# Patient Record
Sex: Male | Born: 1965
Health system: Southern US, Community
[De-identification: ages and names within clinical notes are randomized; demographics above are authoritative.]

## PROBLEM LIST (undated history)

## (undated) DIAGNOSIS — G4730 Sleep apnea, unspecified: Secondary | ICD-10-CM

## (undated) DIAGNOSIS — Z8679 Personal history of other diseases of the circulatory system: Secondary | ICD-10-CM

## (undated) DIAGNOSIS — I10 Essential (primary) hypertension: Secondary | ICD-10-CM

## (undated) HISTORY — DX: Personal history of other diseases of the circulatory system: Z86.79

## (undated) HISTORY — DX: Sleep apnea, unspecified: G47.30

---

## 2005-12-02 ENCOUNTER — Emergency Department (HOSPITAL_COMMUNITY): Admission: EM | Admit: 2005-12-02 | Discharge: 2005-12-02 | Payer: Self-pay | Admitting: Emergency Medicine

## 2005-12-04 ENCOUNTER — Emergency Department (HOSPITAL_COMMUNITY): Admission: EM | Admit: 2005-12-04 | Discharge: 2005-12-04 | Payer: Self-pay | Admitting: Family Medicine

## 2011-07-18 ENCOUNTER — Inpatient Hospital Stay (INDEPENDENT_AMBULATORY_CARE_PROVIDER_SITE_OTHER)
Admission: RE | Admit: 2011-07-18 | Discharge: 2011-07-18 | Disposition: A | Discharge status: Home / Self Care | Payer: BC Managed Care – PPO | Source: Ambulatory Visit | Attending: Family Medicine | Admitting: Family Medicine

## 2011-07-18 DIAGNOSIS — K047 Periapical abscess without sinus: Secondary | ICD-10-CM

## 2014-11-22 ENCOUNTER — Encounter (HOSPITAL_COMMUNITY): Payer: Self-pay | Admitting: *Deleted

## 2014-11-22 ENCOUNTER — Emergency Department (HOSPITAL_COMMUNITY)
Admission: EM | Admit: 2014-11-22 | Discharge: 2014-11-22 | Disposition: A | Discharge status: Home / Self Care | Payer: 59 | Attending: Emergency Medicine | Admitting: Emergency Medicine

## 2014-11-22 DIAGNOSIS — S29002A Unspecified injury of muscle and tendon of back wall of thorax, initial encounter: Secondary | ICD-10-CM | POA: Insufficient documentation

## 2014-11-22 DIAGNOSIS — S8991XA Unspecified injury of right lower leg, initial encounter: Secondary | ICD-10-CM | POA: Insufficient documentation

## 2014-11-22 DIAGNOSIS — Z79899 Other long term (current) drug therapy: Secondary | ICD-10-CM | POA: Diagnosis not present

## 2014-11-22 DIAGNOSIS — Y9389 Activity, other specified: Secondary | ICD-10-CM | POA: Diagnosis not present

## 2014-11-22 DIAGNOSIS — Y92410 Unspecified street and highway as the place of occurrence of the external cause: Secondary | ICD-10-CM | POA: Diagnosis not present

## 2014-11-22 DIAGNOSIS — Y998 Other external cause status: Secondary | ICD-10-CM | POA: Diagnosis not present

## 2014-11-22 DIAGNOSIS — Z791 Long term (current) use of non-steroidal anti-inflammatories (NSAID): Secondary | ICD-10-CM | POA: Diagnosis not present

## 2014-11-22 DIAGNOSIS — I10 Essential (primary) hypertension: Secondary | ICD-10-CM | POA: Diagnosis not present

## 2014-11-22 HISTORY — DX: Essential (primary) hypertension: I10

## 2014-11-22 MED ORDER — NEBIVOLOL HCL 5 MG PO TABS: 5.0000 mg | ORAL_TABLET | Freq: Every day | ORAL | Status: DC

## 2014-11-22 MED ORDER — NEBIVOLOL HCL 5 MG PO TABS
5.0000 mg | ORAL_TABLET | Freq: Every day | ORAL | Status: DC
  Administered 2014-11-22: 5 mg via ORAL
  Filled 2014-11-22: qty 1

## 2014-11-22 MED ORDER — HYDROCHLOROTHIAZIDE 25 MG PO TABS: 25.0000 mg | ORAL_TABLET | Freq: Every day | ORAL | Status: DC

## 2014-11-22 MED ORDER — HYDROCHLOROTHIAZIDE 25 MG PO TABS
25.0000 mg | ORAL_TABLET | Freq: Every day | ORAL | Status: DC
  Administered 2014-11-22: 25 mg via ORAL
  Filled 2014-11-22: qty 1
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
## 2018-02-18 DIAGNOSIS — Z8679 Personal history of other diseases of the circulatory system: Secondary | ICD-10-CM

## 2018-02-18 DIAGNOSIS — I639 Cerebral infarction, unspecified: Secondary | ICD-10-CM

## 2018-02-18 HISTORY — DX: Personal history of other diseases of the circulatory system: Z86.79

## 2018-02-18 HISTORY — DX: Cerebral infarction, unspecified: I63.9

## 2018-02-27 ENCOUNTER — Emergency Department (HOSPITAL_COMMUNITY): Payer: BLUE CROSS/BLUE SHIELD | Admitting: Certified Registered"

## 2018-02-27 ENCOUNTER — Emergency Department (HOSPITAL_COMMUNITY): Payer: BLUE CROSS/BLUE SHIELD

## 2018-02-27 ENCOUNTER — Inpatient Hospital Stay (HOSPITAL_COMMUNITY)
Admission: EM | Admit: 2018-02-27 | Discharge: 2018-03-15 | DRG: 024 | Disposition: A | Discharge status: Skilled Nursing Facility | Payer: BLUE CROSS/BLUE SHIELD | Attending: Neurological Surgery | Admitting: Neurological Surgery

## 2018-02-27 ENCOUNTER — Inpatient Hospital Stay (HOSPITAL_COMMUNITY)
Admission: EM | Disposition: A | Discharge status: Skilled Nursing Facility | Payer: Self-pay | Source: Home / Self Care | Attending: Neurological Surgery

## 2018-02-27 ENCOUNTER — Encounter (HOSPITAL_COMMUNITY): Payer: Self-pay | Admitting: Emergency Medicine

## 2018-02-27 ENCOUNTER — Inpatient Hospital Stay (HOSPITAL_COMMUNITY): Payer: BLUE CROSS/BLUE SHIELD

## 2018-02-27 DIAGNOSIS — E785 Hyperlipidemia, unspecified: Secondary | ICD-10-CM | POA: Diagnosis not present

## 2018-02-27 DIAGNOSIS — R260 Ataxic gait: Secondary | ICD-10-CM | POA: Diagnosis not present

## 2018-02-27 DIAGNOSIS — G9340 Encephalopathy, unspecified: Secondary | ICD-10-CM | POA: Diagnosis present

## 2018-02-27 DIAGNOSIS — R279 Unspecified lack of coordination: Secondary | ICD-10-CM | POA: Diagnosis not present

## 2018-02-27 DIAGNOSIS — I509 Heart failure, unspecified: Secondary | ICD-10-CM | POA: Diagnosis not present

## 2018-02-27 DIAGNOSIS — S0240DA Maxillary fracture, left side, initial encounter for closed fracture: Secondary | ICD-10-CM | POA: Diagnosis not present

## 2018-02-27 DIAGNOSIS — I614 Nontraumatic intracerebral hemorrhage in cerebellum: Principal | ICD-10-CM

## 2018-02-27 DIAGNOSIS — G919 Hydrocephalus, unspecified: Secondary | ICD-10-CM | POA: Diagnosis not present

## 2018-02-27 DIAGNOSIS — M6281 Muscle weakness (generalized): Secondary | ICD-10-CM | POA: Diagnosis not present

## 2018-02-27 DIAGNOSIS — W1830XA Fall on same level, unspecified, initial encounter: Secondary | ICD-10-CM | POA: Diagnosis present

## 2018-02-27 DIAGNOSIS — R51 Headache: Secondary | ICD-10-CM | POA: Diagnosis not present

## 2018-02-27 DIAGNOSIS — I10 Essential (primary) hypertension: Secondary | ICD-10-CM | POA: Diagnosis present

## 2018-02-27 DIAGNOSIS — Z9889 Other specified postprocedural states: Secondary | ICD-10-CM | POA: Diagnosis not present

## 2018-02-27 DIAGNOSIS — T50906A Underdosing of unspecified drugs, medicaments and biological substances, initial encounter: Secondary | ICD-10-CM | POA: Diagnosis present

## 2018-02-27 DIAGNOSIS — I161 Hypertensive emergency: Secondary | ICD-10-CM | POA: Diagnosis not present

## 2018-02-27 DIAGNOSIS — R29708 NIHSS score 8: Secondary | ICD-10-CM | POA: Diagnosis present

## 2018-02-27 DIAGNOSIS — Y92002 Bathroom of unspecified non-institutional (private) residence single-family (private) house as the place of occurrence of the external cause: Secondary | ICD-10-CM | POA: Diagnosis not present

## 2018-02-27 DIAGNOSIS — R402242 Coma scale, best verbal response, confused conversation, at arrival to emergency department: Secondary | ICD-10-CM | POA: Diagnosis not present

## 2018-02-27 DIAGNOSIS — R402142 Coma scale, eyes open, spontaneous, at arrival to emergency department: Secondary | ICD-10-CM | POA: Diagnosis present

## 2018-02-27 DIAGNOSIS — J9690 Respiratory failure, unspecified, unspecified whether with hypoxia or hypercapnia: Secondary | ICD-10-CM

## 2018-02-27 DIAGNOSIS — R402362 Coma scale, best motor response, obeys commands, at arrival to emergency department: Secondary | ICD-10-CM | POA: Diagnosis not present

## 2018-02-27 DIAGNOSIS — Z79899 Other long term (current) drug therapy: Secondary | ICD-10-CM

## 2018-02-27 DIAGNOSIS — S0232XS Fracture of orbital floor, left side, sequela: Secondary | ICD-10-CM | POA: Diagnosis not present

## 2018-02-27 DIAGNOSIS — N281 Cyst of kidney, acquired: Secondary | ICD-10-CM | POA: Diagnosis not present

## 2018-02-27 DIAGNOSIS — S01112A Laceration without foreign body of left eyelid and periocular area, initial encounter: Secondary | ICD-10-CM | POA: Diagnosis not present

## 2018-02-27 DIAGNOSIS — Z4659 Encounter for fitting and adjustment of other gastrointestinal appliance and device: Secondary | ICD-10-CM

## 2018-02-27 DIAGNOSIS — H1132 Conjunctival hemorrhage, left eye: Secondary | ICD-10-CM | POA: Diagnosis not present

## 2018-02-27 DIAGNOSIS — Z01818 Encounter for other preprocedural examination: Secondary | ICD-10-CM

## 2018-02-27 DIAGNOSIS — S0232XA Fracture of orbital floor, left side, initial encounter for closed fracture: Secondary | ICD-10-CM | POA: Diagnosis not present

## 2018-02-27 DIAGNOSIS — Z91128 Patient's intentional underdosing of medication regimen for other reason: Secondary | ICD-10-CM

## 2018-02-27 DIAGNOSIS — S06350A Traumatic hemorrhage of left cerebrum without loss of consciousness, initial encounter: Secondary | ICD-10-CM | POA: Diagnosis not present

## 2018-02-27 DIAGNOSIS — G914 Hydrocephalus in diseases classified elsewhere: Secondary | ICD-10-CM | POA: Diagnosis not present

## 2018-02-27 HISTORY — PX: FACIAL LACERATION REPAIR: SHX6589

## 2018-02-27 HISTORY — PX: CRANIOTOMY: SHX93

## 2018-02-27 LAB — I-STAT CHEM 8, ED
BUN: 11 mg/dL (ref 6–20)
Calcium, Ion: 1.08 mmol/L — ABNORMAL LOW (ref 1.15–1.40)
Chloride: 103 mmol/L (ref 101–111)
Creatinine, Ser: 1.2 mg/dL (ref 0.61–1.24)
Glucose, Bld: 141 mg/dL — ABNORMAL HIGH (ref 65–99)
HEMATOCRIT: 54 % — AB (ref 39.0–52.0)
HEMOGLOBIN: 18.4 g/dL — AB (ref 13.0–17.0)
POTASSIUM: 3.9 mmol/L (ref 3.5–5.1)
SODIUM: 138 mmol/L (ref 135–145)
TCO2: 22 mmol/L (ref 22–32)

## 2018-02-27 LAB — COMPREHENSIVE METABOLIC PANEL
ALBUMIN: 4.6 g/dL (ref 3.5–5.0)
ALT: 26 U/L (ref 17–63)
AST: 46 U/L — AB (ref 15–41)
Alkaline Phosphatase: 88 U/L (ref 38–126)
Anion gap: 17 — ABNORMAL HIGH (ref 5–15)
BILIRUBIN TOTAL: 0.9 mg/dL (ref 0.3–1.2)
BUN: 9 mg/dL (ref 6–20)
CHLORIDE: 101 mmol/L (ref 101–111)
CO2: 19 mmol/L — ABNORMAL LOW (ref 22–32)
CREATININE: 1.33 mg/dL — AB (ref 0.61–1.24)
Calcium: 9.7 mg/dL (ref 8.9–10.3)
GFR calc Af Amer: >60 mL/min (ref >60)
GLUCOSE: 131 mg/dL — AB (ref 65–99)
POTASSIUM: 4.1 mmol/L (ref 3.5–5.1)
Sodium: 137 mmol/L (ref 135–145)
Total Protein: 8.3 g/dL — ABNORMAL HIGH (ref 6.5–8.1)

## 2018-02-27 LAB — CBC
HCT: 49.4 % (ref 39.0–52.0)
Hemoglobin: 16.4 g/dL (ref 13.0–17.0)
MCH: 28.3 pg (ref 26.0–34.0)
MCHC: 33.2 g/dL (ref 30.0–36.0)
MCV: 85.2 fL (ref 78.0–100.0)
PLATELETS: 232 10*3/uL (ref 150–400)
RBC: 5.8 MIL/uL (ref 4.22–5.81)
RDW: 15.9 % — AB (ref 11.5–15.5)
WBC: 17.6 10*3/uL — AB (ref 4.0–10.5)

## 2018-02-27 LAB — PROTIME-INR
INR: 0.98
Prothrombin Time: 12.9 seconds (ref 11.4–15.2)

## 2018-02-27 LAB — I-STAT CG4 LACTIC ACID, ED: Lactic Acid, Venous: 4.87 mmol/L (ref 0.5–1.9)

## 2018-02-27 LAB — ETHANOL: Alcohol, Ethyl (B): <10 mg/dL (ref <10)

## 2018-02-27 SURGERY — CRANIOTOMY HEMATOMA EVACUATION SUBDURAL
Anesthesia: General | Site: Head

## 2018-02-27 MED ORDER — MICROFIBRILLAR COLL HEMOSTAT EX PADS
MEDICATED_PAD | CUTANEOUS | Status: DC | PRN
  Administered 2018-02-27: 1 via TOPICAL

## 2018-02-27 MED ORDER — ACETAMINOPHEN 650 MG RE SUPP: 650.0000 mg | RECTAL | Status: DC | PRN

## 2018-02-27 MED ORDER — OXYCODONE HCL 5 MG PO TABS: 5.0000 mg | ORAL_TABLET | Freq: Once | ORAL | Status: DC | PRN

## 2018-02-27 MED ORDER — THROMBIN (RECOMBINANT) 20000 UNITS EX SOLR
CUTANEOUS | Status: DC | PRN
  Administered 2018-02-27: 20 mL via TOPICAL

## 2018-02-27 MED ORDER — NEBIVOLOL HCL 5 MG PO TABS
5.0000 mg | ORAL_TABLET | Freq: Every day | ORAL | Status: DC
  Administered 2018-02-28 – 2018-03-02 (×3): 5 mg via ORAL
  Filled 2018-02-27 (×3): qty 1

## 2018-02-27 MED ORDER — OXYCODONE HCL 5 MG/5ML PO SOLN: 5.0000 mg | Freq: Once | ORAL | Status: DC | PRN

## 2018-02-27 MED ORDER — HYDROCHLOROTHIAZIDE 25 MG PO TABS
25.0000 mg | ORAL_TABLET | Freq: Every day | ORAL | Status: DC
  Administered 2018-02-28 – 2018-03-15 (×16): 25 mg via ORAL
  Filled 2018-02-27 (×16): qty 1

## 2018-02-27 MED ORDER — LIDOCAINE HCL (CARDIAC) 20 MG/ML IV SOLN
INTRAVENOUS | Status: DC | PRN
  Administered 2018-02-27: 40 mg via INTRATRACHEAL

## 2018-02-27 MED ORDER — PROPOFOL 10 MG/ML IV BOLUS
INTRAVENOUS | Status: DC | PRN
  Administered 2018-02-27: 180 mg via INTRAVENOUS

## 2018-02-27 MED ORDER — LABETALOL HCL 5 MG/ML IV SOLN: 10.0000 mg | INTRAVENOUS | Status: DC | PRN

## 2018-02-27 MED ORDER — NICARDIPINE HCL IN NACL 20-0.86 MG/200ML-% IV SOLN
INTRAVENOUS | Status: AC
  Filled 2018-02-27: qty 200

## 2018-02-27 MED ORDER — PHENYLEPHRINE HCL 10 MG/ML IJ SOLN
INTRAMUSCULAR | Status: DC | PRN
  Administered 2018-02-27 (×3): 80 ug via INTRAVENOUS
  Administered 2018-02-27: 40 ug via INTRAVENOUS
  Administered 2018-02-27 (×3): 80 ug via INTRAVENOUS
  Administered 2018-02-27: 120 ug via INTRAVENOUS
  Administered 2018-02-27: 80 ug via INTRAVENOUS

## 2018-02-27 MED ORDER — DEXAMETHASONE SODIUM PHOSPHATE 4 MG/ML IJ SOLN
4.0000 mg | Freq: Three times a day (TID) | INTRAMUSCULAR | Status: DC
  Administered 2018-03-02 – 2018-03-09 (×24): 4 mg via INTRAVENOUS
  Filled 2018-02-27 (×23): qty 1

## 2018-02-27 MED ORDER — NICARDIPINE HCL IN NACL 20-0.86 MG/200ML-% IV SOLN
3.0000 mg/h | Freq: Once | INTRAVENOUS | Status: AC
  Administered 2018-02-27: 4 mg/h via INTRAVENOUS

## 2018-02-27 MED ORDER — HYDROCODONE-ACETAMINOPHEN 5-325 MG PO TABS: 1.0000 | ORAL_TABLET | ORAL | Status: DC | PRN

## 2018-02-27 MED ORDER — ONDANSETRON HCL 4 MG/2ML IJ SOLN: 4.0000 mg | Freq: Once | INTRAMUSCULAR | Status: DC | PRN

## 2018-02-27 MED ORDER — SODIUM CHLORIDE 0.9 % IV SOLN
INTRAVENOUS | Status: DC | PRN
  Administered 2018-02-27: 500 mL

## 2018-02-27 MED ORDER — PROPOFOL 500 MG/50ML IV EMUL
INTRAVENOUS | Status: DC | PRN
  Administered 2018-02-27: 25 ug/kg/min via INTRAVENOUS

## 2018-02-27 MED ORDER — IOPAMIDOL (ISOVUE-300) INJECTION 61%
100.0000 mL | Freq: Once | INTRAVENOUS | Status: AC | PRN
  Administered 2018-02-27: 100 mL via INTRAVENOUS

## 2018-02-27 MED ORDER — FENTANYL CITRATE (PF) 250 MCG/5ML IJ SOLN
INTRAMUSCULAR | Status: AC
  Filled 2018-02-27: qty 5

## 2018-02-27 MED ORDER — SODIUM CHLORIDE 0.9 % IV SOLN: INTRAVENOUS | Status: DC | PRN

## 2018-02-27 MED ORDER — NICARDIPINE HCL IN NACL 20-0.86 MG/200ML-% IV SOLN
INTRAVENOUS | Status: DC | PRN
  Administered 2018-02-27: 90 mg/h via INTRAVENOUS

## 2018-02-27 MED ORDER — LIDOCAINE-EPINEPHRINE 1 %-1:100000 IJ SOLN
INTRAMUSCULAR | Status: DC | PRN
  Administered 2018-02-27: 4 mL via INTRADERMAL

## 2018-02-27 MED ORDER — BACITRACIN-POLYMYXIN B 500-10000 UNIT/GM OP OINT
TOPICAL_OINTMENT | OPHTHALMIC | Status: DC | PRN
  Administered 2018-02-27: 1 via OPHTHALMIC

## 2018-02-27 MED ORDER — THROMBIN (RECOMBINANT) 5000 UNITS EX SOLR
OROMUCOSAL | Status: DC | PRN
  Administered 2018-02-27 (×2): 5 mL via TOPICAL

## 2018-02-27 MED ORDER — PHENYLEPHRINE 40 MCG/ML (10ML) SYRINGE FOR IV PUSH (FOR BLOOD PRESSURE SUPPORT)
PREFILLED_SYRINGE | INTRAVENOUS | Status: AC
  Filled 2018-02-27: qty 10

## 2018-02-27 MED ORDER — DEXAMETHASONE SODIUM PHOSPHATE 4 MG/ML IJ SOLN
4.0000 mg | Freq: Four times a day (QID) | INTRAMUSCULAR | Status: AC
  Administered 2018-02-28 – 2018-03-01 (×4): 4 mg via INTRAVENOUS
  Filled 2018-02-27 (×4): qty 1

## 2018-02-27 MED ORDER — CEFAZOLIN SODIUM 1 G IJ SOLR
INTRAMUSCULAR | Status: AC
  Filled 2018-02-27: qty 20

## 2018-02-27 MED ORDER — SODIUM CHLORIDE 0.9 % IV SOLN
INTRAVENOUS | Status: DC | PRN
  Administered 2018-02-27: 21:00:00 via INTRAVENOUS

## 2018-02-27 MED ORDER — CEFAZOLIN SODIUM-DEXTROSE 2-3 GM-%(50ML) IV SOLR
INTRAVENOUS | Status: DC | PRN
  Administered 2018-02-27: 2 g via INTRAVENOUS

## 2018-02-27 MED ORDER — ONDANSETRON HCL 4 MG PO TABS: 4.0000 mg | ORAL_TABLET | ORAL | Status: DC | PRN

## 2018-02-27 MED ORDER — BACITRACIN ZINC 500 UNIT/GM EX OINT
TOPICAL_OINTMENT | CUTANEOUS | Status: AC
  Filled 2018-02-27: qty 28.35

## 2018-02-27 MED ORDER — MORPHINE SULFATE (PF) 4 MG/ML IV SOLN
1.0000 mg | INTRAVENOUS | Status: DC | PRN
  Filled 2018-02-27: qty 1

## 2018-02-27 MED ORDER — HYDROMORPHONE HCL 1 MG/ML IJ SOLN
INTRAMUSCULAR | Status: AC
  Filled 2018-02-27: qty 1

## 2018-02-27 MED ORDER — FENTANYL CITRATE (PF) 100 MCG/2ML IJ SOLN: 25.0000 ug | INTRAMUSCULAR | Status: DC | PRN

## 2018-02-27 MED ORDER — POTASSIUM CHLORIDE IN NACL 20-0.9 MEQ/L-% IV SOLN
INTRAVENOUS | Status: DC
  Administered 2018-02-28 – 2018-03-01 (×4): via INTRAVENOUS
  Filled 2018-02-27 (×6): qty 1000

## 2018-02-27 MED ORDER — IOPAMIDOL (ISOVUE-300) INJECTION 61%
INTRAVENOUS | Status: AC
  Filled 2018-02-27: qty 100

## 2018-02-27 MED ORDER — ONDANSETRON HCL 4 MG/2ML IJ SOLN: 4.0000 mg | INTRAMUSCULAR | Status: DC | PRN

## 2018-02-27 MED ORDER — PANTOPRAZOLE SODIUM 40 MG IV SOLR
40.0000 mg | Freq: Every day | INTRAVENOUS | Status: DC
  Administered 2018-02-28: 40 mg via INTRAVENOUS
  Filled 2018-02-27: qty 40

## 2018-02-27 MED ORDER — PROPOFOL 10 MG/ML IV BOLUS
INTRAVENOUS | Status: AC
  Filled 2018-02-27: qty 20

## 2018-02-27 MED ORDER — BACITRACIN-POLYMYXIN B 500-10000 UNIT/GM OP OINT
TOPICAL_OINTMENT | OPHTHALMIC | Status: AC
  Filled 2018-02-27: qty 3.5

## 2018-02-27 MED ORDER — THROMBIN 20000 UNITS EX SOLR
CUTANEOUS | Status: AC
  Filled 2018-02-27: qty 20000

## 2018-02-27 MED ORDER — FENTANYL CITRATE (PF) 250 MCG/5ML IJ SOLN
INTRAMUSCULAR | Status: DC | PRN
  Administered 2018-02-27: 100 ug via INTRAVENOUS
  Administered 2018-02-27 (×2): 50 ug via INTRAVENOUS
  Administered 2018-02-27: 100 ug via INTRAVENOUS
  Administered 2018-02-27 (×3): 50 ug via INTRAVENOUS

## 2018-02-27 MED ORDER — BACITRACIN ZINC 500 UNIT/GM EX OINT
TOPICAL_OINTMENT | CUTANEOUS | Status: DC | PRN
  Administered 2018-02-27 (×2): 1 via TOPICAL

## 2018-02-27 MED ORDER — CEFAZOLIN SODIUM-DEXTROSE 1-4 GM/50ML-% IV SOLN
1.0000 g | Freq: Three times a day (TID) | INTRAVENOUS | Status: AC
  Administered 2018-02-28 (×2): 1 g via INTRAVENOUS
  Filled 2018-02-27 (×2): qty 50

## 2018-02-27 MED ORDER — ROCURONIUM 10MG/ML (10ML) SYRINGE FOR MEDFUSION PUMP - OPTIME
INTRAVENOUS | Status: DC | PRN
  Administered 2018-02-27: 40 mg via INTRAVENOUS
  Administered 2018-02-27: 60 mg via INTRAVENOUS

## 2018-02-27 MED ORDER — LIDOCAINE-EPINEPHRINE 1 %-1:100000 IJ SOLN
INTRAMUSCULAR | Status: AC
  Filled 2018-02-27: qty 1

## 2018-02-27 MED ORDER — THROMBIN 5000 UNITS EX SOLR
CUTANEOUS | Status: AC
  Filled 2018-02-27: qty 5000

## 2018-02-27 MED ORDER — SENNA 8.6 MG PO TABS
1.0000 | ORAL_TABLET | Freq: Two times a day (BID) | ORAL | Status: DC
  Administered 2018-02-28 – 2018-03-15 (×25): 8.6 mg via ORAL
  Filled 2018-02-27 (×28): qty 1

## 2018-02-27 MED ORDER — NEOMYCIN-POLYMYXIN-DEXAMETH 3.5-10000-0.1 OP OINT
TOPICAL_OINTMENT | Freq: Three times a day (TID) | OPHTHALMIC | Status: AC
  Administered 2018-02-28 – 2018-03-01 (×7): via OPHTHALMIC
  Administered 2018-03-02: 1 via OPHTHALMIC
  Administered 2018-03-02 – 2018-03-04 (×6): via OPHTHALMIC
  Filled 2018-02-27: qty 3.5

## 2018-02-27 MED ORDER — SUCCINYLCHOLINE 20MG/ML (10ML) SYRINGE FOR MEDFUSION PUMP - OPTIME
INTRAMUSCULAR | Status: DC | PRN
  Administered 2018-02-27: 180 mg via INTRAVENOUS

## 2018-02-27 MED ORDER — ONDANSETRON HCL 4 MG/2ML IJ SOLN
INTRAMUSCULAR | Status: AC
  Administered 2018-02-27: 4 mg
  Filled 2018-02-27: qty 2

## 2018-02-27 MED ORDER — DEXAMETHASONE SODIUM PHOSPHATE 10 MG/ML IJ SOLN
6.0000 mg | Freq: Four times a day (QID) | INTRAMUSCULAR | Status: AC
  Administered 2018-02-28 (×4): 6 mg via INTRAVENOUS
  Filled 2018-02-27 (×4): qty 1

## 2018-02-27 MED ORDER — ACETAMINOPHEN 325 MG PO TABS
650.0000 mg | ORAL_TABLET | ORAL | Status: DC | PRN
  Administered 2018-03-14: 650 mg via ORAL
  Filled 2018-02-27: qty 2

## 2018-02-27 MED ORDER — SODIUM CHLORIDE 0.9 % IV SOLN
INTRAVENOUS | Status: DC | PRN
  Administered 2018-02-27 (×2): via INTRA_ARTERIAL

## 2018-02-27 MED ORDER — 0.9 % SODIUM CHLORIDE (POUR BTL) OPTIME
TOPICAL | Status: DC | PRN
  Administered 2018-02-27 (×3): 1000 mL

## 2018-02-27 SURGICAL SUPPLY — 63 items
BAG DECANTER FOR FLEXI CONT (MISCELLANEOUS) ×3 IMPLANT
BUR SPIRAL ROUTER 2.3 (BUR) ×3 IMPLANT
CANISTER SUCT 3000ML PPV (MISCELLANEOUS) ×3 IMPLANT
CARTRIDGE OIL MAESTRO DRILL (MISCELLANEOUS) ×2 IMPLANT
CATH VENTRIC 35X38 W/TROCAR LG (CATHETERS) ×1 IMPLANT
CLIP VESOCCLUDE MED 6/CT (CLIP) IMPLANT
DIFFUSER DRILL AIR PNEUMATIC (MISCELLANEOUS) ×3 IMPLANT
DRAPE MICROSCOPE LEICA (MISCELLANEOUS) IMPLANT
DRAPE NEUROLOGICAL W/INCISE (DRAPES) ×3 IMPLANT
DRAPE SURG 17X23 STRL (DRAPES) IMPLANT
DRAPE WARM FLUID 44X44 (DRAPE) ×3 IMPLANT
DRSG OPSITE POSTOP 4X6 (GAUZE/BANDAGES/DRESSINGS) ×1 IMPLANT
DURAPREP 6ML APPLICATOR 50/CS (WOUND CARE) ×3 IMPLANT
ELECT CAUTERY BLADE 6.4 (BLADE) ×3 IMPLANT
ELECT REM PT RETURN 9FT ADLT (ELECTROSURGICAL) ×3
ELECTRODE REM PT RTRN 9FT ADLT (ELECTROSURGICAL) ×2 IMPLANT
EVACUATOR 1/8 PVC DRAIN (DRAIN) IMPLANT
GAUZE SPONGE 4X4 12PLY STRL (GAUZE/BANDAGES/DRESSINGS) ×3 IMPLANT
GAUZE SPONGE 4X4 16PLY XRAY LF (GAUZE/BANDAGES/DRESSINGS) IMPLANT
GLOVE BIO SURGEON STRL SZ7 (GLOVE) IMPLANT
GLOVE BIO SURGEON STRL SZ8 (GLOVE) ×3 IMPLANT
GLOVE BIOGEL PI IND STRL 7.0 (GLOVE) IMPLANT
GLOVE BIOGEL PI INDICATOR 7.0 (GLOVE)
GOWN STRL REUS W/ TWL LRG LVL3 (GOWN DISPOSABLE) IMPLANT
GOWN STRL REUS W/ TWL XL LVL3 (GOWN DISPOSABLE) IMPLANT
GOWN STRL REUS W/TWL 2XL LVL3 (GOWN DISPOSABLE) ×3 IMPLANT
GOWN STRL REUS W/TWL LRG LVL3 (GOWN DISPOSABLE)
GOWN STRL REUS W/TWL XL LVL3 (GOWN DISPOSABLE)
HEMOSTAT POWDER KIT SURGIFOAM (HEMOSTASIS) IMPLANT
KIT BASIN OR (CUSTOM PROCEDURE TRAY) ×3 IMPLANT
KIT CRANIAL ACCESS (MISCELLANEOUS) ×3
KIT CRANIAL ACCESS 5/1X25G (MISCELLANEOUS) IMPLANT
KIT DRAIN CSF ACCUDRAIN (MISCELLANEOUS) ×1 IMPLANT
KIT TURNOVER KIT B (KITS) ×3 IMPLANT
NEEDLE HYPO 22GX1.5 SAFETY (NEEDLE) ×3 IMPLANT
NS IRRIG 1000ML POUR BTL (IV SOLUTION) ×3 IMPLANT
OIL CARTRIDGE MAESTRO DRILL (MISCELLANEOUS) ×3
PACK CRANIOTOMY CUSTOM (CUSTOM PROCEDURE TRAY) ×3 IMPLANT
PAD ARMBOARD 7.5X6 YLW CONV (MISCELLANEOUS) ×3 IMPLANT
PATTIES SURGICAL .25X.25 (GAUZE/BANDAGES/DRESSINGS) IMPLANT
PATTIES SURGICAL .5 X.5 (GAUZE/BANDAGES/DRESSINGS) IMPLANT
PATTIES SURGICAL .5 X3 (DISPOSABLE) IMPLANT
PATTIES SURGICAL 1X1 (DISPOSABLE) IMPLANT
PERFORATOR LRG  14-11MM (BIT) ×1
PERFORATOR LRG 14-11MM (BIT) ×2 IMPLANT
PIN MAYFIELD SKULL DISP (PIN) IMPLANT
RUBBERBAND STERILE (MISCELLANEOUS) IMPLANT
SEALANT ADHERUS EXTEND TIP (MISCELLANEOUS) ×1 IMPLANT
SPONGE NEURO XRAY DETECT 1X3 (DISPOSABLE) IMPLANT
SPONGE SURGIFOAM ABS GEL 100 (HEMOSTASIS) ×3 IMPLANT
STAPLER VISISTAT 35W (STAPLE) ×3 IMPLANT
SUT ETHILON 3 0 FSL (SUTURE) IMPLANT
SUT NURALON 4 0 TR CR/8 (SUTURE) ×5 IMPLANT
SUT VIC AB 1 CT1 18XBRD ANBCTR (SUTURE) IMPLANT
SUT VIC AB 1 CT1 8-18 (SUTURE) ×3
SUT VIC AB 2-0 CP2 18 (SUTURE) ×4 IMPLANT
SUT VIC AB 3-0 SH 8-18 (SUTURE) ×2 IMPLANT
SYR CONTROL 10ML LL (SYRINGE) ×3 IMPLANT
TOWEL GREEN STERILE (TOWEL DISPOSABLE) ×3 IMPLANT
TOWEL GREEN STERILE FF (TOWEL DISPOSABLE) ×3 IMPLANT
TRAY FOLEY W/METER SILVER 16FR (SET/KITS/TRAYS/PACK) IMPLANT
UNDERPAD 30X30 (UNDERPADS AND DIAPERS) IMPLANT
WATER STERILE IRR 1000ML POUR (IV SOLUTION) ×3 IMPLANT

## 2018-02-27 NOTE — ED Notes (Addendum)
Neurosurgery at bedside. Respiratory at bedside.

## 2018-02-27 NOTE — ED Notes (Signed)
Providers at bedside.

## 2018-02-27 NOTE — Progress Notes (Signed)
   02/27/18 1910  Clinical Encounter Type  Visited With Patient and family together  Visit Type Initial  Referral From Nurse  Consult/Referral To Chaplain  Spiritual Encounters  Spiritual Needs Prayer;Emotional  Stress Factors  Family Stress Factors Exhausted  Visited w/ pt wife. Pt very lethargic. Wife distressed. Provided emotional support and prayer. Took wife and friend from ED to 2nd floor waiting area. Went for brief walk outside with wife as she calmed down.
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
## 2018-02-27 NOTE — ED Provider Notes (Signed)
[facility] EMERGENCY DEPARTMENT Provider Note   CSN: [id number] Arrival date & time: 02/27/18  1830     History   Chief Complaint Chief Complaint  Patient presents with  . Head Injury  . Fall    HPI Austin Leonard is a 52 y.o. male.  52 yo M with a chief complaint of confusion and difficulty walking and a headache.  This started this morning and progressed throughout the day.  Per the wife the patient had fallen in the bathroom and landed on his face.  When she looked around the house there are multiple items in the house that have been knocked down on the floor.  The patient was very sleepy but upon awaking will answer questions.  She states he normally sleeps like that.  He has been not taking his blood pressure medicine for some time.  Level 5 caveat altered mental status  The history is provided by the patient and the spouse.  Illness  This is a new problem. The current episode started less than 1 hour ago. The problem occurs constantly. The problem has not changed since onset.Pertinent negatives include no chest pain, no abdominal pain, no headaches and no shortness of breath. Nothing aggravates the symptoms. Nothing relieves the symptoms. He has tried nothing for the symptoms. The treatment provided no relief.    Past Medical History:  Diagnosis Date  . Hypertension     Patient Active Problem List   Diagnosis Date Noted  . S/P craniotomy 02/27/2018    History reviewed. No pertinent surgical history.      Home Medications    Prior to Admission medications   Medication Sig Start Date End Date Taking? Authorizing Provider  acetaminophen (TYLENOL) 500 MG tablet Take 1,000 mg by mouth every 8 (eight) hours as needed (pain).    [provider]  hydrochlorothiazide (HYDRODIURIL) 25 MG tablet Take 1 tablet (25 mg total) by mouth daily. 11/22/14   Elson Areas, PA-C  ibuprofen (ADVIL,MOTRIN) 200 MG tablet Take 600 mg by mouth every 6 (six)  hours as needed (pain).    [provider]  indapamide (LOZOL) 1.25 MG tablet Take 1.25 mg by mouth daily.    [provider]  nebivolol (BYSTOLIC) 5 MG tablet Take 1 tablet (5 mg total) by mouth daily. 11/22/14   Elson Areas, PA-C  rosuvastatin (CRESTOR) 10 MG tablet Take 10 mg by mouth daily.    [provider]    Family History History reviewed. No pertinent family history.  Social History Social History   Tobacco Use  . Smoking status: Unknown If Ever Smoked  . Smokeless tobacco: Never Used  Substance Use Topics  . Alcohol use: Yes    Comment: occ  . Drug use: No     Allergies   Patient has no known allergies.   Review of Systems Review of Systems  Unable to perform ROS: Acuity of condition  Constitutional: Negative for chills and fever.  HENT: Negative for congestion and facial swelling.   Eyes: Positive for pain. Negative for discharge and visual disturbance.  Respiratory: Negative for shortness of breath.   Cardiovascular: Negative for chest pain and palpitations.  Gastrointestinal: Negative for abdominal pain, diarrhea and vomiting.  Musculoskeletal: Negative for arthralgias and myalgias.  Skin: Negative for color change and rash.  Neurological: Negative for tremors, syncope and headaches.  Psychiatric/Behavioral: Negative for confusion and dysphoric mood.     Physical Exam Updated Vital Signs BP (!) 210/129  Pulse 75   Temp 97.6 F (36.4 C) (Oral)   Resp (!) 31   Wt 104.3 kg (230 lb)   SpO2 97%   Physical Exam  Constitutional: He is oriented to person, place, and time. He appears well-developed and well-nourished.  HENT:  Head: Normocephalic and atraumatic.  Laceration around the left eyelid, significant widening along the lateral aspect not including the lateral canthus.  Ecchymosis to the left eye.  Extraocular motor intact  Eyes: Pupils are equal, round, and reactive to light. EOM are normal.  Neck: Normal range of  motion. Neck supple. No JVD present.  Cardiovascular: Normal rate and regular rhythm. Exam reveals no gallop and no friction rub.  No murmur heard. Pulmonary/Chest: No respiratory distress. He has no wheezes.  Abdominal: He exhibits no distension and no mass. There is no tenderness. There is no rebound and no guarding.  Musculoskeletal: Normal range of motion.  Neurological: He is alert and oriented to person, place, and time. GCS eye subscore is 4. GCS verbal subscore is 4. GCS motor subscore is 6.  Grossly neurologically intact  Skin: No rash noted. No pallor.  Psychiatric: He has a normal mood and affect. His behavior is normal.  Nursing note and vitals reviewed.    ED Treatments / Results  Labs (all labs ordered are listed, but only abnormal results are displayed) Labs Reviewed  COMPREHENSIVE METABOLIC PANEL - Abnormal; Notable for the following components:      Result Value   CO2 19 (*)    Glucose, Bld 131 (*)    Creatinine, Ser 1.33 (*)    Total Protein 8.3 (*)    AST 46 (*)    Anion gap 17 (*)    All other components within normal limits  CBC - Abnormal; Notable for the following components:   WBC 17.6 (*)    RDW 15.9 (*)    All other components within normal limits  I-STAT CHEM 8, ED - Abnormal; Notable for the following components:   Glucose, Bld 141 (*)    Calcium, Ion 1.08 (*)    Hemoglobin 18.4 (*)    HCT 54.0 (*)    All other components within normal limits  I-STAT CG4 LACTIC ACID, ED - Abnormal; Notable for the following components:   Lactic Acid, Venous 4.87 (*)    All other components within normal limits  ETHANOL  PROTIME-INR  URINALYSIS, ROUTINE W REFLEX MICROSCOPIC  SAMPLE TO BLOOD BANK    EKG EKG Interpretation  Date/Time:  Wednesday February 27 2018 18:38:48 EDT Ventricular Rate:  123 PR Interval:    QRS Duration: 86 QT Interval:  343 QTC Calculation: 491 R Axis:   68 Text Interpretation:  Sinus tachycardia Nonspecific T abnormalities,  inferior leads Borderline prolonged QT interval No old tracing to compare Confirmed by Melene Plan [phone]) on 02/27/2018 7:01:40 PM   Radiology Ct Head Wo Contrast  Result Date: 02/27/2018 CLINICAL DATA:  Erratically behavior.  Head trauma. EXAM: CT HEAD WITHOUT CONTRAST CT MAXILLOFACIAL WITHOUT CONTRAST CT CERVICAL SPINE WITHOUT CONTRAST TECHNIQUE: Multidetector CT imaging of the head, cervical spine, and maxillofacial structures were performed using the standard protocol without intravenous contrast. Multiplanar CT image reconstructions of the cervical spine and maxillofacial structures were also generated. COMPARISON:  None. FINDINGS: CT HEAD FINDINGS Brain: Acute intraparenchymal hemorrhage in the left cerebellum. Hematoma measures 4.5 x 3.7 x 2.6 cm. (volume = 23). Intraventricular penetration with filling of the fourth ventricle and some reflux into the third and lateral ventricles.  Early ventricular fullness/hydrocephalus. This hemorrhages probably a hypertensive hemorrhage. The cerebral hemispheres and cells appear negative. Vascular: No abnormal vascular finding. Skull: Negative Other: Facial fracture as below. CT MAXILLOFACIAL FINDINGS Osseous: Orbital floor blowout fracture on the left containing fat. Some potential for trapping of the inferior rectus muscle. Fragments are displaced into the maxillary sinus along with a good bit of orbital fat. Fracture of the medial wall the maxillary sinus, bulging into the lateral left nasal passages. Orbits: No evidence of globe disruption. Pre and postseptal hemorrhage. No evidence of confluent postseptal hematoma. Sinuses: Clear other than the traumatic findings of the left maxillary sinus. Soft tissues: Otherwise negative. CT CERVICAL SPINE FINDINGS Alignment: Motion degraded study.  Alignment appears normal. Skull base and vertebrae: No evidence of fracture.  Motion degraded. Soft tissues and spinal canal: Negative Disc levels:  No significant degenerative  changes. Upper chest: Not included. Other: None IMPRESSION: Acute intraparenchymal hemorrhage in the left cerebellar hemisphere with hematoma volume 23 cc. Intraventricular penetration with developing hydrocephalus.These results were called by telephone at the time of interpretation on 02/27/2018 at 7:12 pm to Dr. Melene PlanAN Carlon Davidson , who verbally acknowledged these results. Large orbital floor blowout fracture on the left with displaced fragments into the maxillary sinus. Large amount of herniated orbital fat, with considerable potential for disruption of the biomechanics of the extraocular musculature. This may require surgical reduction. Fracture of the medial wall of the maxillary sinus bulging into the lateral left nasal passages. Motion degraded cervical CT not showing any abnormality. Electronically Signed   By: Paulina FusiMark  Shogry M.D.   On: 02/27/2018 19:20   Ct Chest W Contrast  Result Date: 02/27/2018 CLINICAL DATA:  Patient was having nausea, vomiting and diarrhea this morning and was found by wife in the bathroom having his head on bathtub. Hypertension. Blunt abdominal trauma. EXAM: CT CHEST, ABDOMEN, AND PELVIS WITH CONTRAST TECHNIQUE: Multidetector CT imaging of the chest, abdomen and pelvis was performed following the standard protocol during bolus administration of intravenous contrast. CONTRAST:  100mL ISOVUE-300 IOPAMIDOL (ISOVUE-300) INJECTION 61% COMPARISON:  CXR 12/02/2005 FINDINGS: CT CHEST FINDINGS Cardiovascular: No mediastinal hematoma. Normal size heart with coronary arteriosclerosis. Minimal aortic atherosclerosis without aneurysm or dissection. No large central pulmonary embolus. Mediastinum/Nodes: No mediastinal adenopathy. No dominant mass of the included thyroid nor thyromegaly. Patent midline trachea and mainstem bronchi. Unremarkable CT appearance of the esophagus. Lungs/Pleura: No pneumothorax or effusion. No pulmonary consolidation. The respiratory motion artifacts at the lung bases limit  assessment. Musculoskeletal: No acute fracture. CT ABDOMEN PELVIS FINDINGS Hepatobiliary: No focal liver abnormality is seen. No gallstones, gallbladder wall thickening, or biliary dilatation. Pancreas: Unremarkable. No pancreatic ductal dilatation or surrounding inflammatory changes. Spleen: No splenic injury or perisplenic hematoma. Adrenals/Urinary Tract: Adrenal glands are unremarkable. A 7.1 cm simple cyst is noted off the interpolar left kidney. The right kidney is unremarkable. No nephrolithiasis. No hydroureteronephrosis. Decompressed urinary bladder without focal mural thickening or calculus. Stomach/Bowel: Small hiatal hernia. Nondistended stomach with normal small bowel rotation. No bowel obstruction or inflammation. Normal appendix. Submucosal fat deposition compatible with chronic inflammatory bowel is identified of the colon and rectum with diffuse colonic spasm currently from cecum through distal descending colon. Vascular/Lymphatic: Mild aortoiliac atherosclerosis without adenopathy. Reproductive: Normal size prostate and seminal vesicles. Other: No free air nor free fluid. Musculoskeletal: No acute or significant osseous findings. Left acetabular bone island. IMPRESSION: 1. Coronary arteriosclerosis.  No active pulmonary disease. 2. No acute solid nor hollow visceral organ abnormality. 3. Submucosal fat deposition within  the colon from cecum through descending colon as well as rectum compatible with changes of chronic inflammatory bowel. No active inflammatory process. Diffuse colonic spasm is seen currently. Electronically Signed   By: Tollie Eth M.D.   On: 02/27/2018 19:42   Ct Cervical Spine Wo Contrast  Result Date: 02/27/2018 CLINICAL DATA:  Erratically behavior.  Head trauma. EXAM: CT HEAD WITHOUT CONTRAST CT MAXILLOFACIAL WITHOUT CONTRAST CT CERVICAL SPINE WITHOUT CONTRAST TECHNIQUE: Multidetector CT imaging of the head, cervical spine, and maxillofacial structures were performed using  the standard protocol without intravenous contrast. Multiplanar CT image reconstructions of the cervical spine and maxillofacial structures were also generated. COMPARISON:  None. FINDINGS: CT HEAD FINDINGS Brain: Acute intraparenchymal hemorrhage in the left cerebellum. Hematoma measures 4.5 x 3.7 x 2.6 cm. (volume = 23). Intraventricular penetration with filling of the fourth ventricle and some reflux into the third and lateral ventricles. Early ventricular fullness/hydrocephalus. This hemorrhages probably a hypertensive hemorrhage. The cerebral hemispheres and cells appear negative. Vascular: No abnormal vascular finding. Skull: Negative Other: Facial fracture as below. CT MAXILLOFACIAL FINDINGS Osseous: Orbital floor blowout fracture on the left containing fat. Some potential for trapping of the inferior rectus muscle. Fragments are displaced into the maxillary sinus along with a good bit of orbital fat. Fracture of the medial wall the maxillary sinus, bulging into the lateral left nasal passages. Orbits: No evidence of globe disruption. Pre and postseptal hemorrhage. No evidence of confluent postseptal hematoma. Sinuses: Clear other than the traumatic findings of the left maxillary sinus. Soft tissues: Otherwise negative. CT CERVICAL SPINE FINDINGS Alignment: Motion degraded study.  Alignment appears normal. Skull base and vertebrae: No evidence of fracture.  Motion degraded. Soft tissues and spinal canal: Negative Disc levels:  No significant degenerative changes. Upper chest: Not included. Other: None IMPRESSION: Acute intraparenchymal hemorrhage in the left cerebellar hemisphere with hematoma volume 23 cc. Intraventricular penetration with developing hydrocephalus.These results were called by telephone at the time of interpretation on 02/27/2018 at 7:12 pm to Dr. Melene Plan , who verbally acknowledged these results. Large orbital floor blowout fracture on the left with displaced fragments into the maxillary  sinus. Large amount of herniated orbital fat, with considerable potential for disruption of the biomechanics of the extraocular musculature. This may require surgical reduction. Fracture of the medial wall of the maxillary sinus bulging into the lateral left nasal passages. Motion degraded cervical CT not showing any abnormality. Electronically Signed   By: Paulina Fusi M.D.   On: 02/27/2018 19:20   Ct Abdomen Pelvis W Contrast  Result Date: 02/27/2018 CLINICAL DATA:  Patient was having nausea, vomiting and diarrhea this morning and was found by wife in the bathroom having his head on bathtub. Hypertension. Blunt abdominal trauma. EXAM: CT CHEST, ABDOMEN, AND PELVIS WITH CONTRAST TECHNIQUE: Multidetector CT imaging of the chest, abdomen and pelvis was performed following the standard protocol during bolus administration of intravenous contrast. CONTRAST:  ISOVUE-300 IOPAMIDOL (ISOVUE-300) INJECTION 61% COMPARISON:  CXR 12/02/2005 FINDINGS: CT CHEST FINDINGS Cardiovascular: No mediastinal hematoma. Normal size heart with coronary arteriosclerosis. Minimal aortic atherosclerosis without aneurysm or dissection. No large central pulmonary embolus. Mediastinum/Nodes: No mediastinal adenopathy. No dominant mass of the included thyroid nor thyromegaly. Patent midline trachea and mainstem bronchi. Unremarkable CT appearance of the esophagus. Lungs/Pleura: No pneumothorax or effusion. No pulmonary consolidation. The respiratory motion artifacts at the lung bases limit assessment. Musculoskeletal: No acute fracture. CT ABDOMEN PELVIS FINDINGS Hepatobiliary: No focal liver abnormality is seen. No gallstones, gallbladder wall thickening,  or biliary dilatation. Pancreas: Unremarkable. No pancreatic ductal dilatation or surrounding inflammatory changes. Spleen: No splenic injury or perisplenic hematoma. Adrenals/Urinary Tract: Adrenal glands are unremarkable. A 7.1 cm simple cyst is noted off the interpolar left kidney.  The right kidney is unremarkable. No nephrolithiasis. No hydroureteronephrosis. Decompressed urinary bladder without focal mural thickening or calculus. Stomach/Bowel: Small hiatal hernia. Nondistended stomach with normal small bowel rotation. No bowel obstruction or inflammation. Normal appendix. Submucosal fat deposition compatible with chronic inflammatory bowel is identified of the colon and rectum with diffuse colonic spasm currently from cecum through distal descending colon. Vascular/Lymphatic: Mild aortoiliac atherosclerosis without adenopathy. Reproductive: Normal size prostate and seminal vesicles. Other: No free air nor free fluid. Musculoskeletal: No acute or significant osseous findings. Left acetabular bone island. IMPRESSION: 1. Coronary arteriosclerosis.  No active pulmonary disease. 2. No acute solid nor hollow visceral organ abnormality. 3. Submucosal fat deposition within the colon from cecum through descending colon as well as rectum compatible with changes of chronic inflammatory bowel. No active inflammatory process. Diffuse colonic spasm is seen currently. Electronically Signed   By: Tollie Eth M.D.   On: 02/27/2018 19:42   Ct Maxillofacial Wo Contrast  Result Date: 02/27/2018 CLINICAL DATA:  Erratically behavior.  Head trauma. EXAM: CT HEAD WITHOUT CONTRAST CT MAXILLOFACIAL WITHOUT CONTRAST CT CERVICAL SPINE WITHOUT CONTRAST TECHNIQUE: Multidetector CT imaging of the head, cervical spine, and maxillofacial structures were performed using the standard protocol without intravenous contrast. Multiplanar CT image reconstructions of the cervical spine and maxillofacial structures were also generated. COMPARISON:  None. FINDINGS: CT HEAD FINDINGS Brain: Acute intraparenchymal hemorrhage in the left cerebellum. Hematoma measures 4.5 x 3.7 x 2.6 cm. (volume = 23). Intraventricular penetration with filling of the fourth ventricle and some reflux into the third and lateral ventricles. Early  ventricular fullness/hydrocephalus. This hemorrhages probably a hypertensive hemorrhage. The cerebral hemispheres and cells appear negative. Vascular: No abnormal vascular finding. Skull: Negative Other: Facial fracture as below. CT MAXILLOFACIAL FINDINGS Osseous: Orbital floor blowout fracture on the left containing fat. Some potential for trapping of the inferior rectus muscle. Fragments are displaced into the maxillary sinus along with a good bit of orbital fat. Fracture of the medial wall the maxillary sinus, bulging into the lateral left nasal passages. Orbits: No evidence of globe disruption. Pre and postseptal hemorrhage. No evidence of confluent postseptal hematoma. Sinuses: Clear other than the traumatic findings of the left maxillary sinus. Soft tissues: Otherwise negative. CT CERVICAL SPINE FINDINGS Alignment: Motion degraded study.  Alignment appears normal. Skull base and vertebrae: No evidence of fracture.  Motion degraded. Soft tissues and spinal canal: Negative Disc levels:  No significant degenerative changes. Upper chest: Not included. Other: None IMPRESSION: Acute intraparenchymal hemorrhage in the left cerebellar hemisphere with hematoma volume 23 cc. Intraventricular penetration with developing hydrocephalus.These results were called by telephone at the time of interpretation on 02/27/2018 at 7:12 pm to Dr. Melene Plan , who verbally acknowledged these results. Large orbital floor blowout fracture on the left with displaced fragments into the maxillary sinus. Large amount of herniated orbital fat, with considerable potential for disruption of the biomechanics of the extraocular musculature. This may require surgical reduction. Fracture of the medial wall of the maxillary sinus bulging into the lateral left nasal passages. Motion degraded cervical CT not showing any abnormality. Electronically Signed   By: Paulina Fusi M.D.   On: 02/27/2018 19:20    Procedures Procedures (including critical care  time)  Medications Ordered in ED Medications  iopamidol (  ISOVUE-300) 61 % injection (has no administration in time range)  0.9 %  sodium chloride infusion ( Intra-arterial New Bag/Given 02/27/18 2240)  0.9 %  sodium chloride infusion (has no administration in time range)  lidocaine-EPINEPHrine (XYLOCAINE W/EPI) 1 %-1:100000 (with pres) injection (4 mLs Intradermal Given 02/27/18 2129)  bacitracin 50,000 Units in sodium chloride 0.9 % 500 mL irrigation (500 mLs Irrigation Given 02/27/18 2132)  bacitracin ointment (1 application Topical Given 02/27/18 2228)  0.9 % irrigation (POUR BTL) (1,000 mLs Irrigation Given 02/27/18 2159)  Surgifoam 1 Gm with Thrombin 5,000 units (5 ml) topical solution (5 mLs Topical Given 02/27/18 2136)  Surgifoam 100 with Thrombin 20,000 units (20 ml) topical solution (20 mLs Topical Given 02/27/18 2136)  microfibrllar collagen (AVITENE) pad (1 each Topical Given 02/27/18 2203)  ondansetron (ZOFRAN) 4 MG/2ML injection (4 mg  Given 02/27/18 1842)  iopamidol (ISOVUE-300) 61 % injection 100 mL (100 mLs Intravenous Contrast Given 02/27/18 1847)  nicardipine (CARDENE) 20mg  in 0.86% saline IV infusion (0.1 mg/ml) (9 mg/hr Intravenous Rate/Dose Change 02/27/18 2010)     Initial Impression / Assessment and Plan / ED Course  I have reviewed the triage vital signs and the nursing notes.  Pertinent labs & imaging results that were available during my care of the patient were reviewed by me and considered in my medical decision making (see chart for details).     52 yo M with a chief complaint of head injury.  Patient arrived and was very sleepy on exam, hypertensive concern for ICH.  Taken emergently back to CT. my initial read of the CT scan was concerning for a intracranial hemorrhage.  Started on a Cardene drip.  Arterial line placed.  Page out to neurosurgery.  Patient was taken emergently to the operating room for craniectomy.  The patient has a complicated laceration to the  left upper eyelid.  I was not comfortable with suturing this and so I discussed the case with Dr. Lazarus Salines, ENT he will come and evaluate the patient.  He also was noted to have a orbital blowout fracture in the medial wall fracture on CT scan.  Discussed with trauma for eval.   CRITICAL CARE Performed by: Rae Roam   Total critical care time: 80 minutes  Critical care time was exclusive of separately billable procedures and treating other patients.  Critical care was necessary to treat or prevent imminent or life-threatening deterioration.  Critical care was time spent personally by me on the following activities: development of treatment plan with patient and/or surrogate as well as nursing, discussions with consultants, evaluation of patient's response to treatment, examination of patient, obtaining history from patient or surrogate, ordering and performing treatments and interventions, ordering and review of laboratory studies, ordering and review of radiographic studies, pulse oximetry and re-evaluation of patient's condition.  The patients results and plan were reviewed and discussed.   Any x-rays performed were independently reviewed by myself.   Differential diagnosis were considered with the presenting HPI.  Medications  iopamidol (ISOVUE-300) 61 % injection (has no administration in time range)  0.9 %  sodium chloride infusion ( Intra-arterial New Bag/Given 02/27/18 2240)  0.9 %  sodium chloride infusion (has no administration in time range)  lidocaine-EPINEPHrine (XYLOCAINE W/EPI) 1 %-1:100000 (with pres) injection (4 mLs Intradermal Given 02/27/18 2129)  bacitracin 50,000 Units in sodium chloride 0.9 % 500 mL irrigation (500 mLs Irrigation Given 02/27/18 2132)  bacitracin ointment (1 application Topical Given 02/27/18 2228)  0.9 % irrigation (  POUR BTL) (1,000 mLs Irrigation Given 02/27/18 2159)  Surgifoam 1 Gm with Thrombin 5,000 units (5 ml) topical solution (5 mLs  Topical Given 02/27/18 2136)  Surgifoam 100 with Thrombin 20,000 units (20 ml) topical solution (20 mLs Topical Given 02/27/18 2136)  microfibrllar collagen (AVITENE) pad (1 each Topical Given 02/27/18 2203)  ondansetron (ZOFRAN) 4 MG/2ML injection (4 mg  Given 02/27/18 1842)  iopamidol (ISOVUE-300) 61 % injection 100 mL (100 mLs Intravenous Contrast Given 02/27/18 1847)  nicardipine (CARDENE) 20mg  in 0.86% saline IV infusion (0.1 mg/ml) (9 mg/hr Intravenous Rate/Dose Change 02/27/18 2010)    Vitals:   02/27/18 1830 02/27/18 1831 02/27/18 1915 02/27/18 2002  BP:  (!) 192/126 (!) 210/129   Pulse:  90 75   Resp:   (!) 31   Temp:  97.6 F (36.4 C)    TempSrc:  Oral    SpO2: 98% 100% 97%   Weight:    104.3 kg (230 lb)    Final diagnoses:  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

## 2018-02-27 NOTE — ED Triage Notes (Signed)
Per EMS:  Patient left at 810am by Wife stating he was having some nausea, vomiting, and diarrhea and may stay home from work.  Patient not heard from all day.  When his wife got home tonight she states she noted a hole in the wall that was not there before she left, furniture that is strewn about.  Patient went in to bathroom, states he felt dizzy and fell.  [facility]  Patient able to be roused with loud voice.  HR unstable, EMS states range from 40-120 en route.  Patient hypertensive.

## 2018-02-27 NOTE — ED Notes (Signed)
Pt to CT with RN, MD, and EMT

## 2018-02-27 NOTE — Op Note (Signed)
02/27/2018  10:48 PM  PATIENT:  Austin Leonard  52 y.o. male  PRE-OPERATIVE DIAGNOSIS:  Cerebellar hemorrhage with hydrocephalus  POST-OPERATIVE DIAGNOSIS:  same  PROCEDURE:  1. Right frontal ventriculostomy drain placement, 2. Suboccipital craniectomy for evacuation of left cerebellar hematoma  SURGEON:  Marikay Alar, MD  ASSISTANTS: none  ANESTHESIA:   General  EBL: 100 ml  Total I/O In: 1000 [I.V.:1000] Out: 300 [Urine:200; Blood:100]  BLOOD ADMINISTERED: none  DRAINS: Right frontal ventriculostomy drain  SPECIMEN:  none  INDICATION FOR PROCEDURE: This patient presented with altered mental status. Imaging showed large cerebellar hemorrhage with hydrocephalus.  Recommended placement of a ventriculostomy drain to manage the hydrocephalus followed by a a suboccipital craniectomy for evacuation of hematoma. Patient understood the risks, benefits, and alternatives and potential outcomes and wished to proceed.  PROCEDURE DETAILS: The patient was taken to the operating room and after induction of adequate general endotracheal anesthesia his right frontal region was prepped with alcohol and then DuraPrep and then draped in usual sterile fashion. 2 mL of local anesthesia injected and a small stab incision was made just behind the hairline in line with the mid pupillary line. I then drilled a twist drill craniotomy and open the dura with a stab. I then passed a ventriculostomy drain toward the right lateral ventricle and hit CSF about 5 cm deep. I then continued until was about 7 cm deep and then tunneled the catheter to a separate stab incision. I then hooked it to his distal reservoir and sewed it into place and placed an occlusive dressing.  The patient was affixed a 3 point [place] headrest and rolled into the prone position on chest rolls. All pressure points were padded. The suboccipital and posterior cervical region was cleaned and prepped with DuraPrep and then draped in the usual  sterile fashion. 7 cc of local anesthesia was injected and a dorsal midline incision made in the posterior cervical region from the inion down to C2 and carried down to the cervical fascia and the suboccipital region. The fascia was opened and the paraspinous musculature was taken down to expose the inion down to the foramen magnum down to C2. I continued to dissect laterally especially on the left. A self-retaining retractor was placed. Then used the high-speed drill to perform a craniectomy on the left side of the suboccipital region. The dura was opened in a cruciate fashion and a small corticectomy was made and the leaf found clot under pressure. I used suction and bipolar to remove significant amounts of dark jellylike blood clot. I then irrigated and continued to drive the surgical bed with bipolar cautery and with Surgifoam. I spent considerable time trying this dry the surgical bed. I then used Surgifoam impacted this. This seemed to work the best. The wound appeared to be dry. I lined the brain with Surgicel and a small amount of Gelfoam. I was able to place a few sutures in the dura. I then lined the dura with Gelfoam and Tisseel fibrin glue. I then placed more Gelfoam..I irrigated with saline solution containing bacitracin.After hemostasis was achieved I closed the muscle and the fascia with 0 Vicryl, subcutaneous tissue with 2-0 Vicryl, and the subcuticular tissue with 3-0 Vicryl. The skin was closed with staples. A sterile dressing was applied, the patient was turned to the supine position and taken out of the headrest, with care would be turned over to ENT to fix his lacerated left eyelid.  At the end of the procedure all  sponge, needle and instrument counts were correct.    PLAN OF CARE: Admit to inpatient   PATIENT DISPOSITION:  ICU - intubated and critically ill.   Delay start of Pharmacological VTE agent (>24hrs) due to surgical blood loss or risk of bleeding:   yes

## 2018-02-27 NOTE — Anesthesia Procedure Notes (Signed)
Central Venous Catheter Insertion Performed by: Kipp BroodJoslin, Luisa Louk, MD, anesthesiologist Start/End4/08/2018 9:20 PM, 02/27/2018 9:25 PM Patient location: Pre-op. Preanesthetic checklist: patient identified, IV checked, site marked, risks and benefits discussed, surgical consent, monitors and equipment checked, pre-op evaluation, timeout performed and anesthesia consent Position: Trendelenburg Lidocaine 1% used for infiltration and patient sedated Hand hygiene performed  and maximum sterile barriers used  Catheter size: 8 Fr Total catheter length 16. Central line was placed.Double lumen Procedure performed without using ultrasound guided technique. Attempts: 1 Following insertion, dressing applied and line sutured. Post procedure assessment: blood return through all ports  Patient tolerated the procedure well with no immediate complications.

## 2018-02-27 NOTE — ED Notes (Signed)
Flloyd, MD notified of critical lactic level.

## 2018-02-27 NOTE — Anesthesia Procedure Notes (Signed)
Procedure Name: Intubation Date/Time: 02/27/2018 8:26 PM Performed by: Molli HazardGordon, Josedaniel Haye M, CRNA Pre-anesthesia Checklist: Patient identified, Emergency Drugs available, Suction available and Patient being monitored Patient Re-evaluated:Patient Re-evaluated prior to induction Oxygen Delivery Method: Circle system utilized Preoxygenation: Pre-oxygenation with 100% oxygen Induction Type: IV induction, Rapid sequence and Cricoid Pressure applied Laryngoscope Size: Miller and 2 Grade View: Grade II Tube type: Subglottic suction tube Tube size: 7.5 mm Number of attempts: 1 Airway Equipment and Method: Stylet Placement Confirmation: ETT inserted through vocal cords under direct vision,  positive ETCO2 and breath sounds checked- equal and bilateral Secured at: 23 cm Tube secured with: Tape Dental Injury: Teeth and Oropharynx as per pre-operative assessment  Comments: Oropharynx very red and edematous.

## 2018-02-27 NOTE — Anesthesia Preprocedure Evaluation (Addendum)
Anesthesia Evaluation  Patient identified by MRN, date of birth, ID band Patient awake    Reviewed: Allergy & Precautions, NPO status , Patient's Chart, lab work & pertinent test results  Airway Mallampati: III  TM Distance: >3 FB Neck ROM: Full    Dental  (+) Teeth Intact, Dental Advisory Given   Pulmonary    breath sounds clear to auscultation       Cardiovascular hypertension,  Rhythm:Regular Rate:Normal     Neuro/Psych    GI/Hepatic   Endo/Other    Renal/GU      Musculoskeletal   Abdominal   Peds  Hematology   Anesthesia Other Findings Patient lethargic but following simple commands. Moving all extremities  Reproductive/Obstetrics                             Anesthesia Physical Anesthesia Plan  ASA: IV and emergent  Anesthesia Plan: General   Post-op Pain Management:    Induction: Intravenous  PONV Risk Score and Plan: Ondansetron and Dexamethasone  Airway Management Planned: Oral ETT  Additional Equipment: Arterial line and CVP  Intra-op Plan:   Post-operative Plan: Post-operative intubation/ventilation  Informed Consent: I have reviewed the patients History and Physical, chart, labs and discussed the procedure including the risks, benefits and alternatives for the proposed anesthesia with the patient or authorized representative who has indicated his/her understanding and acceptance.     Plan Discussed with: CRNA and Anesthesiologist  Anesthesia Plan Comments:         Anesthesia Quick Evaluation

## 2018-02-27 NOTE — Procedures (Signed)
Arterial Catheter Insertion Procedure Note Austin Leonard [id number] February 21, 1966  Procedure: Insertion of Arterial Catheter  Indications: Blood pressure monitoring  Procedure Details Consent: Risks of procedure as well as the alternatives and risks of each were explained to the (patient/caregiver).  Consent for procedure obtained. and Unable to obtain consent because of altered level of consciousness. Time Out: Verified patient identification, verified procedure, site/side was marked, verified correct patient position, special equipment/implants available, medications/allergies/relevent history reviewed, required imaging and test results available.  Performed  Maximum sterile technique was used including antiseptics, cap, gloves, gown, hand hygiene and mask. Skin prep: Chlorhexidine; local anesthetic administered 22 gauge catheter was inserted into left radial artery using the Seldinger technique. ULTRASOUND GUIDANCE USED: NO Evaluation Blood flow good; BP tracing good. Complications: No apparent complications.   Austin Leonard, Austin Leonard 02/27/2018

## 2018-02-27 NOTE — Consult Note (Signed)
Austin Leonard, Austin Leonard 52 y.o., male [id number]     Chief Complaint: LEFT eyelid laceration  HPI: 52 yo bm, sustained presumed hypertensive intracranial bleed, LEFT posterior fossa today.  Fell in bathroom and may have struck face on bathtub.  Evaluation in ED shows LEFT intracerebellar hemorrhage.  LEFT medial orbital blow out fx including fx of medial wall of maxillary sinus, and herniation of orbital fat.  Inferior rectus looks to be in good position.  Zygoma and arch intact.  LEFT lateral upper lid laceration noted.  ENT called for assistance.   Pt evaluated in OR at conclusion of suboccipital craniectomy per Dr Ronnald Ramp.   PMH: Past Medical History:  Diagnosis Date  . Hypertension     Surg JK:KXFGHWE reviewed. No pertinent surgical history.  FHx:  History reviewed. No pertinent family history. SocHx:  has an unknown smoking status. He has never used smokeless tobacco. He reports that he drinks alcohol. He reports that he does not use drugs.  ALLERGIES: No Known Allergies  Medications Prior to Admission  Medication Sig Dispense Refill  . acetaminophen (TYLENOL) 500 MG tablet Take 1,000 mg by mouth every 8 (eight) hours as needed (pain).    . hydrochlorothiazide (HYDRODIURIL) 25 MG tablet Take 1 tablet (25 mg total) by mouth daily. 30 tablet 2  . ibuprofen (ADVIL,MOTRIN) 200 MG tablet Take 600 mg by mouth every 6 (six) hours as needed (pain).    . indapamide (LOZOL) 1.25 MG tablet Take 1.25 mg by mouth daily.    . nebivolol (BYSTOLIC) 5 MG tablet Take 1 tablet (5 mg total) by mouth daily. 30 tablet 2  . rosuvastatin (CRESTOR) 10 MG tablet Take 10 mg by mouth daily.      Results for orders placed or performed during the hospital encounter of 02/27/18 (from the past 48 hour(s))  Ethanol     Status: None   Collection Time: 02/27/18  6:30 PM  Result Value Ref Range   Alcohol, Ethyl (B) <10 <10 mg/dL    Comment:        LOWEST DETECTABLE LIMIT FOR SERUM ALCOHOL IS 10 mg/dL FOR MEDICAL  PURPOSES ONLY Performed at [facility] [address] [place], [place] [address]   Comprehensive metabolic panel     Status: Abnormal   Collection Time: 02/27/18  6:58 PM  Result Value Ref Range   Sodium 137 135 - 145 mmol/L   Potassium 4.1 3.5 - 5.1 mmol/L   Chloride 101 101 - 111 mmol/L   CO2 19 (L) 22 - 32 mmol/L   Glucose, Bld 131 (H) 65 - 99 mg/dL   BUN 9 6 - 20 mg/dL   Creatinine, Ser 1.33 (H) 0.61 - 1.24 mg/dL   Calcium 9.7 8.9 - 10.3 mg/dL   Total Protein 8.3 (H) 6.5 - 8.1 g/dL   Albumin 4.6 3.5 - 5.0 g/dL   AST 46 (H) 15 - 41 U/L   ALT 26 17 - 63 U/L   Alkaline Phosphatase 88 38 - 126 U/L   Total Bilirubin 0.9 0.3 - 1.2 mg/dL   GFR calc non Af Amer >60 >60 mL/min   GFR calc Af Amer >60 >60 mL/min    Comment: (NOTE) The eGFR has been calculated using the CKD EPI equation. This calculation has not been validated in all clinical situations. eGFR's persistently <60 mL/min signify possible Chronic Kidney Disease.    Anion gap 17 (H) 5 - 15    Comment: Performed at [facility], [address] [place], [place] [address]  CBC     Status: Abnormal   Collection Time: 02/27/18  6:58 PM  Result Value Ref Range   WBC 17.6 (H) 4.0 - 10.5 K/uL   RBC 5.80 4.22 - 5.81 MIL/uL   Hemoglobin 16.4 13.0 - 17.0 g/dL   HCT 49.4 39.0 - 52.0 %   MCV 85.2 78.0 - 100.0 fL   MCH 28.3 26.0 - 34.0 pg   MCHC 33.2 30.0 - 36.0 g/dL   RDW 15.9 (H) 11.5 - 15.5 %   Platelets 232 150 - 400 K/uL    Comment: Performed at [facility] [address], [place], [place] [address]  Protime-INR     Status: None   Collection Time: 02/27/18  6:58 PM  Result Value Ref Range   Prothrombin Time 12.9 11.4 - 15.2 seconds   INR 0.98     Comment: Performed at [facility] [address], [place],  [address]  I-Stat Chem 8, ED     Status: Abnormal   Collection Time: 02/27/18  7:08 PM  Result Value Ref Range   Sodium 138 135 - 145 mmol/L   Potassium 3.9 3.5 - 5.1 mmol/L    Chloride 103 101 - 111 mmol/L   BUN 11 6 - 20 mg/dL   Creatinine, Ser 1.20 0.61 - 1.24 mg/dL   Glucose, Bld 141 (H) 65 - 99 mg/dL   Calcium, Ion 1.08 (L) 1.15 - 1.40 mmol/L   TCO2 22 22 - 32 mmol/L   Hemoglobin 18.4 (H) 13.0 - 17.0 g/dL   HCT 54.0 (H) 39.0 - 52.0 %  I-Stat CG4 Lactic Acid, ED     Status: Abnormal   Collection Time: 02/27/18  7:09 PM  Result Value Ref Range   Lactic Acid, Venous 4.87 (HH) 0.5 - 1.9 mmol/L   Comment NOTIFIED PHYSICIAN   Sample to Blood Bank     Status: None   Collection Time: 02/27/18  7:55 PM  Result Value Ref Range   Blood Bank Specimen SAMPLE AVAILABLE FOR TESTING    Sample Expiration      03/02/2018 Performed at [facility], [address] [place], [place] [address]    Ct Head Wo Contrast  Result Date: 02/27/2018 CLINICAL DATA:  Erratically behavior.  Head trauma. EXAM: CT HEAD WITHOUT CONTRAST CT MAXILLOFACIAL WITHOUT CONTRAST CT CERVICAL SPINE WITHOUT CONTRAST TECHNIQUE: Multidetector CT imaging of the head, cervical spine, and maxillofacial structures were performed using the standard protocol without intravenous contrast. Multiplanar CT image reconstructions of the cervical spine and maxillofacial structures were also generated. COMPARISON:  None. FINDINGS: CT HEAD FINDINGS Brain: Acute intraparenchymal hemorrhage in the left cerebellum. Hematoma measures 4.5 x 3.7 x 2.6 cm. (volume = 23). Intraventricular penetration with filling of the fourth ventricle and some reflux into the third and lateral ventricles. Early ventricular fullness/hydrocephalus. This hemorrhages probably a hypertensive hemorrhage. The cerebral hemispheres and cells appear negative. Vascular: No abnormal vascular finding. Skull: Negative Other: Facial fracture as below. CT MAXILLOFACIAL FINDINGS Osseous: Orbital floor blowout fracture on the left containing fat. Some potential for trapping of the inferior rectus muscle. Fragments are displaced into the maxillary sinus along  with a good bit of orbital fat. Fracture of the medial wall the maxillary sinus, bulging into the lateral left nasal passages. Orbits: No evidence of globe disruption. Pre and postseptal hemorrhage. No evidence of confluent postseptal hematoma. Sinuses: Clear other than the traumatic findings of the left maxillary  sinus. Soft tissues: Otherwise negative. CT CERVICAL SPINE FINDINGS Alignment: Motion degraded study.  Alignment appears normal. Skull base and vertebrae: No evidence of fracture.  Motion degraded. Soft tissues and spinal canal: Negative Disc levels:  No significant degenerative changes. Upper chest: Not included. Other: None IMPRESSION: Acute intraparenchymal hemorrhage in the left cerebellar hemisphere with hematoma volume 23 cc. Intraventricular penetration with developing hydrocephalus.These results were called by telephone at the time of interpretation on 02/27/2018 at 7:12 pm to Dr. Deno Etienne , who verbally acknowledged these results. Large orbital floor blowout fracture on the left with displaced fragments into the maxillary sinus. Large amount of herniated orbital fat, with considerable potential for disruption of the biomechanics of the extraocular musculature. This may require surgical reduction. Fracture of the medial wall of the maxillary sinus bulging into the lateral left nasal passages. Motion degraded cervical CT not showing any abnormality. Electronically Signed   By: Nelson Chimes M.D.   On: 02/27/2018 19:20   Ct Chest W Contrast  Result Date: 02/27/2018 CLINICAL DATA:  Patient was having nausea, vomiting and diarrhea this morning and was found by wife in the bathroom having his head on bathtub. Hypertension. Blunt abdominal trauma. EXAM: CT CHEST, ABDOMEN, AND PELVIS WITH CONTRAST TECHNIQUE: Multidetector CT imaging of the chest, abdomen and pelvis was performed following the standard protocol during bolus administration of intravenous contrast. CONTRAST:  113m ISOVUE-300 IOPAMIDOL  (ISOVUE-300) INJECTION 61% COMPARISON:  CXR 12/02/2005 FINDINGS: CT CHEST FINDINGS Cardiovascular: No mediastinal hematoma. Normal size heart with coronary arteriosclerosis. Minimal aortic atherosclerosis without aneurysm or dissection. No large central pulmonary embolus. Mediastinum/Nodes: No mediastinal adenopathy. No dominant mass of the included thyroid nor thyromegaly. Patent midline trachea and mainstem bronchi. Unremarkable CT appearance of the esophagus. Lungs/Pleura: No pneumothorax or effusion. No pulmonary consolidation. The respiratory motion artifacts at the lung bases limit assessment. Musculoskeletal: No acute fracture. CT ABDOMEN PELVIS FINDINGS Hepatobiliary: No focal liver abnormality is seen. No gallstones, gallbladder wall thickening, or biliary dilatation. Pancreas: Unremarkable. No pancreatic ductal dilatation or surrounding inflammatory changes. Spleen: No splenic injury or perisplenic hematoma. Adrenals/Urinary Tract: Adrenal glands are unremarkable. A 7.1 cm simple cyst is noted off the interpolar left kidney. The right kidney is unremarkable. No nephrolithiasis. No hydroureteronephrosis. Decompressed urinary bladder without focal mural thickening or calculus. Stomach/Bowel: Small hiatal hernia. Nondistended stomach with normal small bowel rotation. No bowel obstruction or inflammation. Normal appendix. Submucosal fat deposition compatible with chronic inflammatory bowel is identified of the colon and rectum with diffuse colonic spasm currently from cecum through distal descending colon. Vascular/Lymphatic: Mild aortoiliac atherosclerosis without adenopathy. Reproductive: Normal size prostate and seminal vesicles. Other: No free air nor free fluid. Musculoskeletal: No acute or significant osseous findings. Left acetabular bone island. IMPRESSION: 1. Coronary arteriosclerosis.  No active pulmonary disease. 2. No acute solid nor hollow visceral organ abnormality. 3. Submucosal fat deposition  within the colon from cecum through descending colon as well as rectum compatible with changes of chronic inflammatory bowel. No active inflammatory process. Diffuse colonic spasm is seen currently. Electronically Signed   By: DAshley RoyaltyM.D.   On: 02/27/2018 19:42   Ct Cervical Spine Wo Contrast  Result Date: 02/27/2018 CLINICAL DATA:  Erratically behavior.  Head trauma. EXAM: CT HEAD WITHOUT CONTRAST CT MAXILLOFACIAL WITHOUT CONTRAST CT CERVICAL SPINE WITHOUT CONTRAST TECHNIQUE: Multidetector CT imaging of the head, cervical spine, and maxillofacial structures were performed using the standard protocol without intravenous contrast. Multiplanar CT image reconstructions of the cervical spine and maxillofacial structures  were also generated. COMPARISON:  None. FINDINGS: CT HEAD FINDINGS Brain: Acute intraparenchymal hemorrhage in the left cerebellum. Hematoma measures 4.5 x 3.7 x 2.6 cm. (volume = 23). Intraventricular penetration with filling of the fourth ventricle and some reflux into the third and lateral ventricles. Early ventricular fullness/hydrocephalus. This hemorrhages probably a hypertensive hemorrhage. The cerebral hemispheres and cells appear negative. Vascular: No abnormal vascular finding. Skull: Negative Other: Facial fracture as below. CT MAXILLOFACIAL FINDINGS Osseous: Orbital floor blowout fracture on the left containing fat. Some potential for trapping of the inferior rectus muscle. Fragments are displaced into the maxillary sinus along with a good bit of orbital fat. Fracture of the medial wall the maxillary sinus, bulging into the lateral left nasal passages. Orbits: No evidence of globe disruption. Pre and postseptal hemorrhage. No evidence of confluent postseptal hematoma. Sinuses: Clear other than the traumatic findings of the left maxillary sinus. Soft tissues: Otherwise negative. CT CERVICAL SPINE FINDINGS Alignment: Motion degraded study.  Alignment appears normal. Skull base and  vertebrae: No evidence of fracture.  Motion degraded. Soft tissues and spinal canal: Negative Disc levels:  No significant degenerative changes. Upper chest: Not included. Other: None IMPRESSION: Acute intraparenchymal hemorrhage in the left cerebellar hemisphere with hematoma volume 23 cc. Intraventricular penetration with developing hydrocephalus.These results were called by telephone at the time of interpretation on 02/27/2018 at 7:12 pm to Dr. Deno Etienne , who verbally acknowledged these results. Large orbital floor blowout fracture on the left with displaced fragments into the maxillary sinus. Large amount of herniated orbital fat, with considerable potential for disruption of the biomechanics of the extraocular musculature. This may require surgical reduction. Fracture of the medial wall of the maxillary sinus bulging into the lateral left nasal passages. Motion degraded cervical CT not showing any abnormality. Electronically Signed   By: Nelson Chimes M.D.   On: 02/27/2018 19:20   Ct Abdomen Pelvis W Contrast  Result Date: 02/27/2018 CLINICAL DATA:  Patient was having nausea, vomiting and diarrhea this morning and was found by wife in the bathroom having his head on bathtub. Hypertension. Blunt abdominal trauma. EXAM: CT CHEST, ABDOMEN, AND PELVIS WITH CONTRAST TECHNIQUE: Multidetector CT imaging of the chest, abdomen and pelvis was performed following the standard protocol during bolus administration of intravenous contrast. CONTRAST:  169m ISOVUE-300 IOPAMIDOL (ISOVUE-300) INJECTION 61% COMPARISON:  CXR 12/02/2005 FINDINGS: CT CHEST FINDINGS Cardiovascular: No mediastinal hematoma. Normal size heart with coronary arteriosclerosis. Minimal aortic atherosclerosis without aneurysm or dissection. No large central pulmonary embolus. Mediastinum/Nodes: No mediastinal adenopathy. No dominant mass of the included thyroid nor thyromegaly. Patent midline trachea and mainstem bronchi. Unremarkable CT appearance of  the esophagus. Lungs/Pleura: No pneumothorax or effusion. No pulmonary consolidation. The respiratory motion artifacts at the lung bases limit assessment. Musculoskeletal: No acute fracture. CT ABDOMEN PELVIS FINDINGS Hepatobiliary: No focal liver abnormality is seen. No gallstones, gallbladder wall thickening, or biliary dilatation. Pancreas: Unremarkable. No pancreatic ductal dilatation or surrounding inflammatory changes. Spleen: No splenic injury or perisplenic hematoma. Adrenals/Urinary Tract: Adrenal glands are unremarkable. A 7.1 cm simple cyst is noted off the interpolar left kidney. The right kidney is unremarkable. No nephrolithiasis. No hydroureteronephrosis. Decompressed urinary bladder without focal mural thickening or calculus. Stomach/Bowel: Small hiatal hernia. Nondistended stomach with normal small bowel rotation. No bowel obstruction or inflammation. Normal appendix. Submucosal fat deposition compatible with chronic inflammatory bowel is identified of the colon and rectum with diffuse colonic spasm currently from cecum through distal descending colon. Vascular/Lymphatic: Mild aortoiliac atherosclerosis without adenopathy. Reproductive:  Normal size prostate and seminal vesicles. Other: No free air nor free fluid. Musculoskeletal: No acute or significant osseous findings. Left acetabular bone island. IMPRESSION: 1. Coronary arteriosclerosis.  No active pulmonary disease. 2. No acute solid nor hollow visceral organ abnormality. 3. Submucosal fat deposition within the colon from cecum through descending colon as well as rectum compatible with changes of chronic inflammatory bowel. No active inflammatory process. Diffuse colonic spasm is seen currently. Electronically Signed   By: Ashley Royalty M.D.   On: 02/27/2018 19:42   Ct Maxillofacial Wo Contrast  Result Date: 02/27/2018 CLINICAL DATA:  Erratically behavior.  Head trauma. EXAM: CT HEAD WITHOUT CONTRAST CT MAXILLOFACIAL WITHOUT CONTRAST CT  CERVICAL SPINE WITHOUT CONTRAST TECHNIQUE: Multidetector CT imaging of the head, cervical spine, and maxillofacial structures were performed using the standard protocol without intravenous contrast. Multiplanar CT image reconstructions of the cervical spine and maxillofacial structures were also generated. COMPARISON:  None. FINDINGS: CT HEAD FINDINGS Brain: Acute intraparenchymal hemorrhage in the left cerebellum. Hematoma measures 4.5 x 3.7 x 2.6 cm. (volume = 23). Intraventricular penetration with filling of the fourth ventricle and some reflux into the third and lateral ventricles. Early ventricular fullness/hydrocephalus. This hemorrhages probably a hypertensive hemorrhage. The cerebral hemispheres and cells appear negative. Vascular: No abnormal vascular finding. Skull: Negative Other: Facial fracture as below. CT MAXILLOFACIAL FINDINGS Osseous: Orbital floor blowout fracture on the left containing fat. Some potential for trapping of the inferior rectus muscle. Fragments are displaced into the maxillary sinus along with a good bit of orbital fat. Fracture of the medial wall the maxillary sinus, bulging into the lateral left nasal passages. Orbits: No evidence of globe disruption. Pre and postseptal hemorrhage. No evidence of confluent postseptal hematoma. Sinuses: Clear other than the traumatic findings of the left maxillary sinus. Soft tissues: Otherwise negative. CT CERVICAL SPINE FINDINGS Alignment: Motion degraded study.  Alignment appears normal. Skull base and vertebrae: No evidence of fracture.  Motion degraded. Soft tissues and spinal canal: Negative Disc levels:  No significant degenerative changes. Upper chest: Not included. Other: None IMPRESSION: Acute intraparenchymal hemorrhage in the left cerebellar hemisphere with hematoma volume 23 cc. Intraventricular penetration with developing hydrocephalus.These results were called by telephone at the time of interpretation on 02/27/2018 at 7:12 pm to Dr.  Deno Etienne , who verbally acknowledged these results. Large orbital floor blowout fracture on the left with displaced fragments into the maxillary sinus. Large amount of herniated orbital fat, with considerable potential for disruption of the biomechanics of the extraocular musculature. This may require surgical reduction. Fracture of the medial wall of the maxillary sinus bulging into the lateral left nasal passages. Motion degraded cervical CT not showing any abnormality. Electronically Signed   By: Nelson Chimes M.D.   On: 02/27/2018 19:20      Blood pressure (!) 210/129, pulse 75, temperature 97.6 F (36.4 C), temperature source Oral, resp. rate (!) 31, weight 104.3 kg (230 lb), SpO2 97 %.  PHYSICAL EXAM: Overall appearance:  Intubated and under general anesthesia. Head:  Dressings.  No abnormal bony facial contours. Eyes:  2-3 mm wide strip of upper lid skin avulsed including lash bearing skin.  Subconjunctival hemorrhage and lateral chemosis OS.   Ears:  Not examined Nose:  Not examined Oral Cavity:  OT tube in place Oral Pharynx/Hypopharynx/Larynx:  Not examined Neuro:  Could not assess Neck:  Did not assess  Studies Reviewed:  CT maxillofacial    Assessment/Plan LEFT lateral upper lid avulsion with loss of lashes.  LEFT medial orbital blow out fracture  Plan:  Lid skin re-approximated to mucosa at inferior edge of tarsal plate, 2 cm distance, using interrupted 6-0 plain gut suture.  Bacitracin Ophth ointment applied.  When awake and able to cooperate, will need Ophth consultation.  May require ORIF LEFT orbital blow out fx.  Medial maxillary fx will not require any repair.    Ice, elevation, antibiosis (Keflex or similar), no nose blowing x 2 weeks.    Jodi Marble 3/58/2518, 11:23 PM

## 2018-02-27 NOTE — ED Notes (Signed)
Dr. Floyd at bedside. 

## 2018-02-27 NOTE — H&P (Signed)
Reason for Consult: Cerebellar hemorrhage Referring Physician: EDP  JOSH NICOLOSI is an 52 y.o. male.   HPI:  52 year old male who was noted to have dizziness and headache and not feeling well earlier today. Wife came back home to find the house in disarray and him less responsive. He was brought to emergency department with these findings. 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 The patient has a left orbital fracture also. ENT has been consulted. Patient is lethargic but arousable and cannot cooperate with history and physical very well.  Past Medical History:  Diagnosis Date  . Hypertension     History reviewed. No pertinent surgical history.  No Known Allergies  Social History   Tobacco Use  . Smoking status: Unknown If Ever Smoked  . Smokeless tobacco: Never Used  Substance Use Topics  . Alcohol use: Yes    Comment: occ    History reviewed. No pertinent family history.   Review of Systems  Positive ROS: Unable to obtain  All other systems have been reviewed and were otherwise negative with the exception of those mentioned in the HPI and as above.  Objective: Vital signs in last 24 hours: Temp:  [97.6 F (36.4 C)] 97.6 F (36.4 C) (04/10 1831) Pulse Rate:  [75-90] 75 (04/10 1915) Resp:  [31] 31 (04/10 1915) BP: (192-210)/(126-129) 210/129 (04/10 1915) SpO2:  [97 %-100 %] 97 % (04/10 1915)  General Appearance: Lethargic but arousable Head: Normocephalic, without obvious abnormality, atraumatic Eyes: PERRL, obvious left eyelid laceration with significant scleral edema and hemorrhage and swelling around the left eye     Ears: Normal TM's and external ear canals, both ears Throat: Unable to see Neck: Supple Lungs:  respirations unlabored, at times some and there is some irregularity Heart: Regular rate and rhythm Abdomen: Soft  NEUROLOGIC:   Mental status: Lethargic but arouses to voice and states name and  follows commands with all 4 extremities Motor Exam - grossly normal, normal tone and bulk as best I can tell Sensory Exam - unable to test Reflexes: symmetric, no pathologic reflexes, No Hoffman's, No clonus Coordination - unable to test Gait - unable to test Balance - unable to test Cranial Nerves: I: smell Not tested  II: visual acuity  OS: na    OD: na  II: visual fields   II: pupils Seemed to react   III,VII: ptosis   III,IV,VI: extraocular muscles    V: mastication   V: facial light touch sensation    V,VII: corneal reflex    VII: facial muscle function - upper  Equal smile   VII: facial muscle function - lower Equal smile   VIII: hearing Follows commands   IX: soft palate elevation    IX,X: gag reflex Present   XI: trapezius strength    XI: sternocleidomastoid strength   XI: neck flexion strength    XII: tongue strength  Protrudes in midline     Data Review Lab Results  Component Value Date   HGB 18.4 (H) 02/27/2018   HCT 54.0 (H) 02/27/2018   Lab Results  Component Value Date   NA 138 02/27/2018   K 3.9 02/27/2018   CL 103 02/27/2018   BUN 11 02/27/2018   CREATININE 1.20 02/27/2018   GLUCOSE 141 (H) 02/27/2018   No results found for: INR, PROTIME  Radiology: Ct Head Wo Contrast  Result Date: 02/27/2018 CLINICAL DATA:  Erratically behavior.  Head trauma. EXAM: CT HEAD WITHOUT CONTRAST  CT MAXILLOFACIAL WITHOUT CONTRAST CT CERVICAL SPINE WITHOUT CONTRAST TECHNIQUE: Multidetector CT imaging of the head, cervical spine, and maxillofacial structures were performed using the standard protocol without intravenous contrast. Multiplanar CT image reconstructions of the cervical spine and maxillofacial structures were also generated. COMPARISON:  None. FINDINGS: CT HEAD FINDINGS Brain: Acute intraparenchymal hemorrhage in the left cerebellum. Hematoma measures 4.5 x 3.7 x 2.6 cm. (volume = 23). Intraventricular penetration with filling of the fourth ventricle and some reflux  into the third and lateral ventricles. Early ventricular fullness/hydrocephalus. This hemorrhages probably a hypertensive hemorrhage. The cerebral hemispheres and cells appear negative. Vascular: No abnormal vascular finding. Skull: Negative Other: Facial fracture as below. CT MAXILLOFACIAL FINDINGS Osseous: Orbital floor blowout fracture on the left containing fat. Some potential for trapping of the inferior rectus muscle. Fragments are displaced into the maxillary sinus along with a good bit of orbital fat. Fracture of the medial wall the maxillary sinus, bulging into the lateral left nasal passages. Orbits: No evidence of globe disruption. Pre and postseptal hemorrhage. No evidence of confluent postseptal hematoma. Sinuses: Clear other than the traumatic findings of the left maxillary sinus. Soft tissues: Otherwise negative. CT CERVICAL SPINE FINDINGS Alignment: Motion degraded study.  Alignment appears normal. Skull base and vertebrae: No evidence of fracture.  Motion degraded. Soft tissues and spinal canal: Negative Disc levels:  No significant degenerative changes. Upper chest: Not included. Other: None IMPRESSION: Acute intraparenchymal hemorrhage in the left cerebellar hemisphere with hematoma volume 23 cc. Intraventricular penetration with developing hydrocephalus.These results were called by telephone at the time of interpretation on 02/27/2018 at 7:12 pm to Dr. Melene Plan , who verbally acknowledged these results. Large orbital floor blowout fracture on the left with displaced fragments into the maxillary sinus. Large amount of herniated orbital fat, with considerable potential for disruption of the biomechanics of the extraocular musculature. This may require surgical reduction. Fracture of the medial wall of the maxillary sinus bulging into the lateral left nasal passages. Motion degraded cervical CT not showing any abnormality. Electronically Signed   By: Paulina Fusi M.D.   On: 02/27/2018 19:20   Ct  Chest W Contrast  Result Date: 02/27/2018 CLINICAL DATA:  Patient was having nausea, vomiting and diarrhea this morning and was found by wife in the bathroom having his head on bathtub. Hypertension. Blunt abdominal trauma. EXAM: CT CHEST, ABDOMEN, AND PELVIS WITH CONTRAST TECHNIQUE: Multidetector CT imaging of the chest, abdomen and pelvis was performed following the standard protocol during bolus administration of intravenous contrast. CONTRAST:  ISOVUE-300 IOPAMIDOL (ISOVUE-300) INJECTION 61% COMPARISON:  CXR 12/02/2005 FINDINGS: CT CHEST FINDINGS Cardiovascular: No mediastinal hematoma. Normal size heart with coronary arteriosclerosis. Minimal aortic atherosclerosis without aneurysm or dissection. No large central pulmonary embolus. Mediastinum/Nodes: No mediastinal adenopathy. No dominant mass of the included thyroid nor thyromegaly. Patent midline trachea and mainstem bronchi. Unremarkable CT appearance of the esophagus. Lungs/Pleura: No pneumothorax or effusion. No pulmonary consolidation. The respiratory motion artifacts at the lung bases limit assessment. Musculoskeletal: No acute fracture. CT ABDOMEN PELVIS FINDINGS Hepatobiliary: No focal liver abnormality is seen. No gallstones, gallbladder wall thickening, or biliary dilatation. Pancreas: Unremarkable. No pancreatic ductal dilatation or surrounding inflammatory changes. Spleen: No splenic injury or perisplenic hematoma. Adrenals/Urinary Tract: Adrenal glands are unremarkable. A 7.1 cm simple cyst is noted off the interpolar left kidney. The right kidney is unremarkable. No nephrolithiasis. No hydroureteronephrosis. Decompressed urinary bladder without focal mural thickening or calculus. Stomach/Bowel: Small hiatal hernia. Nondistended stomach with normal small bowel rotation.  No bowel obstruction or inflammation. Normal appendix. Submucosal fat deposition compatible with chronic inflammatory bowel is identified of the colon and rectum with  diffuse colonic spasm currently from cecum through distal descending colon. Vascular/Lymphatic: Mild aortoiliac atherosclerosis without adenopathy. Reproductive: Normal size prostate and seminal vesicles. Other: No free air nor free fluid. Musculoskeletal: No acute or significant osseous findings. Left acetabular bone island. IMPRESSION: 1. Coronary arteriosclerosis.  No active pulmonary disease. 2. No acute solid nor hollow visceral organ abnormality. 3. Submucosal fat deposition within the colon from cecum through descending colon as well as rectum compatible with changes of chronic inflammatory bowel. No active inflammatory process. Diffuse colonic spasm is seen currently. Electronically Signed   By: Tollie Eth M.D.   On: 02/27/2018 19:42   Ct Cervical Spine Wo Contrast  Result Date: 02/27/2018 CLINICAL DATA:  Erratically behavior.  Head trauma. EXAM: CT HEAD WITHOUT CONTRAST CT MAXILLOFACIAL WITHOUT CONTRAST CT CERVICAL SPINE WITHOUT CONTRAST TECHNIQUE: Multidetector CT imaging of the head, cervical spine, and maxillofacial structures were performed using the standard protocol without intravenous contrast. Multiplanar CT image reconstructions of the cervical spine and maxillofacial structures were also generated. COMPARISON:  None. FINDINGS: CT HEAD FINDINGS Brain: Acute intraparenchymal hemorrhage in the left cerebellum. Hematoma measures 4.5 x 3.7 x 2.6 cm. (volume = 23). Intraventricular penetration with filling of the fourth ventricle and some reflux into the third and lateral ventricles. Early ventricular fullness/hydrocephalus. This hemorrhages probably a hypertensive hemorrhage. The cerebral hemispheres and cells appear negative. Vascular: No abnormal vascular finding. Skull: Negative Other: Facial fracture as below. CT MAXILLOFACIAL FINDINGS Osseous: Orbital floor blowout fracture on the left containing fat. Some potential for trapping of the inferior rectus muscle. Fragments are displaced into the  maxillary sinus along with a good bit of orbital fat. Fracture of the medial wall the maxillary sinus, bulging into the lateral left nasal passages. Orbits: No evidence of globe disruption. Pre and postseptal hemorrhage. No evidence of confluent postseptal hematoma. Sinuses: Clear other than the traumatic findings of the left maxillary sinus. Soft tissues: Otherwise negative. CT CERVICAL SPINE FINDINGS Alignment: Motion degraded study.  Alignment appears normal. Skull base and vertebrae: No evidence of fracture.  Motion degraded. Soft tissues and spinal canal: Negative Disc levels:  No significant degenerative changes. Upper chest: Not included. Other: None IMPRESSION: Acute intraparenchymal hemorrhage in the left cerebellar hemisphere with hematoma volume 23 cc. Intraventricular penetration with developing hydrocephalus.These results were called by telephone at the time of interpretation on 02/27/2018 at 7:12 pm to Dr. Melene Plan , who verbally acknowledged these results. Large orbital floor blowout fracture on the left with displaced fragments into the maxillary sinus. Large amount of herniated orbital fat, with considerable potential for disruption of the biomechanics of the extraocular musculature. This may require surgical reduction. Fracture of the medial wall of the maxillary sinus bulging into the lateral left nasal passages. Motion degraded cervical CT not showing any abnormality. Electronically Signed   By: Paulina Fusi M.D.   On: 02/27/2018 19:20   Ct Abdomen Pelvis W Contrast  Result Date: 02/27/2018 CLINICAL DATA:  Patient was having nausea, vomiting and diarrhea this morning and was found by wife in the bathroom having his head on bathtub. Hypertension. Blunt abdominal trauma. EXAM: CT CHEST, ABDOMEN, AND PELVIS WITH CONTRAST TECHNIQUE: Multidetector CT imaging of the chest, abdomen and pelvis was performed following the standard protocol during bolus administration of intravenous contrast. CONTRAST:   ISOVUE-300 IOPAMIDOL (ISOVUE-300) INJECTION 61% COMPARISON:  CXR 12/02/2005 FINDINGS:  CT CHEST FINDINGS Cardiovascular: No mediastinal hematoma. Normal size heart with coronary arteriosclerosis. Minimal aortic atherosclerosis without aneurysm or dissection. No large central pulmonary embolus. Mediastinum/Nodes: No mediastinal adenopathy. No dominant mass of the included thyroid nor thyromegaly. Patent midline trachea and mainstem bronchi. Unremarkable CT appearance of the esophagus. Lungs/Pleura: No pneumothorax or effusion. No pulmonary consolidation. The respiratory motion artifacts at the lung bases limit assessment. Musculoskeletal: No acute fracture. CT ABDOMEN PELVIS FINDINGS Hepatobiliary: No focal liver abnormality is seen. No gallstones, gallbladder wall thickening, or biliary dilatation. Pancreas: Unremarkable. No pancreatic ductal dilatation or surrounding inflammatory changes. Spleen: No splenic injury or perisplenic hematoma. Adrenals/Urinary Tract: Adrenal glands are unremarkable. A 7.1 cm simple cyst is noted off the interpolar left kidney. The right kidney is unremarkable. No nephrolithiasis. No hydroureteronephrosis. Decompressed urinary bladder without focal mural thickening or calculus. Stomach/Bowel: Small hiatal hernia. Nondistended stomach with normal small bowel rotation. No bowel obstruction or inflammation. Normal appendix. Submucosal fat deposition compatible with chronic inflammatory bowel is identified of the colon and rectum with diffuse colonic spasm currently from cecum through distal descending colon. Vascular/Lymphatic: Mild aortoiliac atherosclerosis without adenopathy. Reproductive: Normal size prostate and seminal vesicles. Other: No free air nor free fluid. Musculoskeletal: No acute or significant osseous findings. Left acetabular bone island. IMPRESSION: 1. Coronary arteriosclerosis.  No active pulmonary disease. 2. No acute solid nor hollow visceral organ abnormality. 3.  Submucosal fat deposition within the colon from cecum through descending colon as well as rectum compatible with changes of chronic inflammatory bowel. No active inflammatory process. Diffuse colonic spasm is seen currently. Electronically Signed   By: Tollie Ethavid  Kwon M.D.   On: 02/27/2018 19:42   Ct Maxillofacial Wo Contrast  Result Date: 02/27/2018 CLINICAL DATA:  Erratically behavior.  Head trauma. EXAM: CT HEAD WITHOUT CONTRAST CT MAXILLOFACIAL WITHOUT CONTRAST CT CERVICAL SPINE WITHOUT CONTRAST TECHNIQUE: Multidetector CT imaging of the head, cervical spine, and maxillofacial structures were performed using the standard protocol without intravenous contrast. Multiplanar CT image reconstructions of the cervical spine and maxillofacial structures were also generated. COMPARISON:  None. FINDINGS: CT HEAD FINDINGS Brain: Acute intraparenchymal hemorrhage in the left cerebellum. Hematoma measures 4.5 x 3.7 x 2.6 cm. (volume = 23). Intraventricular penetration with filling of the fourth ventricle and some reflux into the third and lateral ventricles. Early ventricular fullness/hydrocephalus. This hemorrhages probably a hypertensive hemorrhage. The cerebral hemispheres and cells appear negative. Vascular: No abnormal vascular finding. Skull: Negative Other: Facial fracture as below. CT MAXILLOFACIAL FINDINGS Osseous: Orbital floor blowout fracture on the left containing fat. Some potential for trapping of the inferior rectus muscle. Fragments are displaced into the maxillary sinus along with a good bit of orbital fat. Fracture of the medial wall the maxillary sinus, bulging into the lateral left nasal passages. Orbits: No evidence of globe disruption. Pre and postseptal hemorrhage. No evidence of confluent postseptal hematoma. Sinuses: Clear other than the traumatic findings of the left maxillary sinus. Soft tissues: Otherwise negative. CT CERVICAL SPINE FINDINGS Alignment: Motion degraded study.  Alignment appears  normal. Skull base and vertebrae: No evidence of fracture.  Motion degraded. Soft tissues and spinal canal: Negative Disc levels:  No significant degenerative changes. Upper chest: Not included. Other: None IMPRESSION: Acute intraparenchymal hemorrhage in the left cerebellar hemisphere with hematoma volume 23 cc. Intraventricular penetration with developing hydrocephalus.These results were called by telephone at the time of interpretation on 02/27/2018 at 7:12 pm to Dr. Melene PlanAN FLOYD , who verbally acknowledged these results. Large orbital floor blowout fracture on  the left with displaced fragments into the maxillary sinus. Large amount of herniated orbital fat, with considerable potential for disruption of the biomechanics of the extraocular musculature. This may require surgical reduction. Fracture of the medial wall of the maxillary sinus bulging into the lateral left nasal passages. Motion degraded cervical CT not showing any abnormality. Electronically Signed   By: Paulina Fusi M.D.   On: 02/27/2018 19:20     Assessment/Plan: There is no height or weight on file to calculate BMI.   52 year old white male history hypertension with a large left cerebellar hemorrhage with extension into the fourth ventricle with temporal horns consistent with early hydrocephalus, lethargy. I have recommended a left suboccipital craniectomy for evacuation of the intercerebral hemorrhage. I have recommended placement of a preoperative ventriculostomy drain. I have spoken with the wife at length about this. She understands the risks include but aren't limited to bleeding, infection, stroke, loss of vision, numbness, weakness, paralysis, lack of relief of symptoms, worsening symptoms, need for further surgery, CSF leak, meningitis, cranial nerve injury, brainstem injury, and anesthesia risk including DVT pneumonia MI and death. She agrees to proceed in an emergent fashion.   Lorieann Argueta S 02/27/2018 7:50 PM

## 2018-02-27 NOTE — OR Nursing (Addendum)
Dr. Marikay Alaravid [name] injected 2ml Xylocaine 1.5% with epinephrine 1:200,000 (exp. 07-19) when placing the ventricular catheter.

## 2018-02-27 NOTE — ED Notes (Signed)
Dr. Adela LankFloyd returned to bedside to update pt and spouse on CT results.

## 2018-02-27 NOTE — Transfer of Care (Signed)
Immediate Anesthesia Transfer of Care Note  Patient: Austin Leonard  Procedure(s) Performed: CRANIECTOMY FOR SUBOCCIPTAL HEMORRHAGE (N/A Head) EYELID  LACERATION CLOSURE (Left Face)  Patient Location: NICU  Anesthesia Type:General  Level of Consciousness: Patient remains intubated per anesthesia plan  Airway & Oxygen Therapy: Patient placed on Ventilator (see vital sign flow sheet for setting)  Post-op Assessment: Report given to RN and Post -op Vital signs reviewed and stable  Post vital signs: Reviewed and stable  Last Vitals:  Vitals Value Taken Time  BP 132/83 02/27/2018 11:30 PM  Temp    Pulse 99 02/27/2018 11:30 PM  Resp 18 02/27/2018 11:44 PM  SpO2    Vitals shown include unvalidated device data.  Last Pain:  Vitals:   02/27/18 1831  TempSrc: Oral         Complications: No apparent anesthesia complications

## 2018-02-28 ENCOUNTER — Inpatient Hospital Stay (HOSPITAL_COMMUNITY): Payer: BLUE CROSS/BLUE SHIELD

## 2018-02-28 ENCOUNTER — Encounter (HOSPITAL_COMMUNITY): Payer: Self-pay | Admitting: Emergency Medicine

## 2018-02-28 ENCOUNTER — Other Ambulatory Visit: Payer: Self-pay

## 2018-02-28 DIAGNOSIS — J9601 Acute respiratory failure with hypoxia: Secondary | ICD-10-CM

## 2018-02-28 DIAGNOSIS — J9602 Acute respiratory failure with hypercapnia: Secondary | ICD-10-CM

## 2018-02-28 DIAGNOSIS — I614 Nontraumatic intracerebral hemorrhage in cerebellum: Principal | ICD-10-CM

## 2018-02-28 DIAGNOSIS — Z9889 Other specified postprocedural states: Secondary | ICD-10-CM

## 2018-02-28 DIAGNOSIS — Z01818 Encounter for other preprocedural examination: Secondary | ICD-10-CM

## 2018-02-28 LAB — BASIC METABOLIC PANEL
Anion gap: 12 (ref 5–15)
BUN: 11 mg/dL (ref 6–20)
CHLORIDE: 107 mmol/L (ref 101–111)
CO2: 19 mmol/L — AB (ref 22–32)
CREATININE: 1.51 mg/dL — AB (ref 0.61–1.24)
Calcium: 8.7 mg/dL — ABNORMAL LOW (ref 8.9–10.3)
GFR calc Af Amer: >60 mL/min (ref >60)
GFR calc non Af Amer: 53 mL/min — ABNORMAL LOW (ref >60)
Glucose, Bld: 115 mg/dL — ABNORMAL HIGH (ref 65–99)
POTASSIUM: 4.1 mmol/L (ref 3.5–5.1)
Sodium: 138 mmol/L (ref 135–145)

## 2018-02-28 LAB — POCT I-STAT 3, ART BLOOD GAS (G3+)
ACID-BASE DEFICIT: 9 mmol/L — AB (ref 0.0–2.0)
BICARBONATE: 18.1 mmol/L — AB (ref 20.0–28.0)
O2 SAT: 98 %
PO2 ART: 126 mmHg — AB (ref 83.0–108.0)
Patient temperature: 36.6
TCO2: 19 mmol/L — AB (ref 22–32)
pCO2 arterial: 39.7 mmHg (ref 32.0–48.0)
pH, Arterial: 7.265 — ABNORMAL LOW (ref 7.350–7.450)

## 2018-02-28 LAB — URINALYSIS, ROUTINE W REFLEX MICROSCOPIC
Bacteria, UA: NONE SEEN
Bilirubin Urine: NEGATIVE
Glucose, UA: NEGATIVE mg/dL
Ketones, ur: NEGATIVE mg/dL
Leukocytes, UA: NEGATIVE
Nitrite: NEGATIVE
PH: 6 (ref 5.0–8.0)
Protein, ur: 30 mg/dL — AB
SPECIFIC GRAVITY, URINE: 1.025 (ref 1.005–1.030)

## 2018-02-28 LAB — LACTIC ACID, PLASMA: Lactic Acid, Venous: 3.7 mmol/L (ref 0.5–1.9)

## 2018-02-28 LAB — SAMPLE TO BLOOD BANK

## 2018-02-28 LAB — TRIGLYCERIDES: Triglycerides: 180 mg/dL — ABNORMAL HIGH (ref <150)

## 2018-02-28 LAB — CBC
HEMATOCRIT: 43.2 % (ref 39.0–52.0)
Hemoglobin: 14.2 g/dL (ref 13.0–17.0)
MCH: 28 pg (ref 26.0–34.0)
MCHC: 32.9 g/dL (ref 30.0–36.0)
MCV: 85.2 fL (ref 78.0–100.0)
PLATELETS: 210 10*3/uL (ref 150–400)
RBC: 5.07 MIL/uL (ref 4.22–5.81)
RDW: 16.3 % — AB (ref 11.5–15.5)
WBC: 16.7 10*3/uL — AB (ref 4.0–10.5)

## 2018-02-28 LAB — MRSA PCR SCREENING: MRSA by PCR: POSITIVE — AB

## 2018-02-28 MED ORDER — NICARDIPINE HCL IN NACL 20-0.86 MG/200ML-% IV SOLN
3.0000 mg/h | INTRAVENOUS | Status: DC
  Administered 2018-02-28: 5 mg/h via INTRAVENOUS
  Administered 2018-02-28: 6 mg/h via INTRAVENOUS
  Administered 2018-02-28: 5 mg/h via INTRAVENOUS
  Administered 2018-02-28 (×4): 10 mg/h via INTRAVENOUS
  Administered 2018-03-01: 8 mg/h via INTRAVENOUS
  Administered 2018-03-01: 6 mg/h via INTRAVENOUS
  Administered 2018-03-01 (×2): 8 mg/h via INTRAVENOUS
  Filled 2018-02-28 (×13): qty 200

## 2018-02-28 MED ORDER — MUPIROCIN 2 % EX OINT
1.0000 "application " | TOPICAL_OINTMENT | Freq: Two times a day (BID) | CUTANEOUS | Status: AC
  Administered 2018-02-28 – 2018-03-05 (×10): 1 via NASAL
  Filled 2018-02-28: qty 22

## 2018-02-28 MED ORDER — FENTANYL CITRATE (PF) 100 MCG/2ML IJ SOLN
100.0000 ug | INTRAMUSCULAR | Status: DC | PRN
Start: 2018-02-28 — End: 2018-03-09
  Administered 2018-02-28: 100 ug via INTRAVENOUS
  Filled 2018-02-28: qty 2

## 2018-02-28 MED ORDER — PROPOFOL 1000 MG/100ML IV EMUL: 0.0000 ug/kg/min | INTRAVENOUS | Status: DC

## 2018-02-28 MED ORDER — SODIUM CHLORIDE 0.9 % IV SOLN
1.0000 g | Freq: Once | INTRAVENOUS | Status: AC
  Administered 2018-02-28: 1 g via INTRAVENOUS
  Filled 2018-02-28 (×2): qty 10

## 2018-02-28 MED ORDER — CHLORHEXIDINE GLUCONATE CLOTH 2 % EX PADS: 6.0000 | MEDICATED_PAD | Freq: Every day | CUTANEOUS | Status: DC

## 2018-02-28 MED ORDER — CHLORHEXIDINE GLUCONATE CLOTH 2 % EX PADS
6.0000 | MEDICATED_PAD | Freq: Every morning | CUTANEOUS | Status: DC
  Administered 2018-03-01 – 2018-03-06 (×5): 6 via TOPICAL

## 2018-02-28 MED ORDER — FENTANYL CITRATE (PF) 100 MCG/2ML IJ SOLN: 100.0000 ug | INTRAMUSCULAR | Status: DC | PRN

## 2018-02-28 MED ORDER — LABETALOL HCL 5 MG/ML IV SOLN
20.0000 mg | Freq: Four times a day (QID) | INTRAVENOUS | Status: DC | PRN
  Administered 2018-03-02: 20 mg via INTRAVENOUS
  Filled 2018-02-28: qty 4

## 2018-02-28 MED ORDER — CHLORHEXIDINE GLUCONATE 0.12% ORAL RINSE (MEDLINE KIT)
15.0000 mL | Freq: Two times a day (BID) | OROMUCOSAL | Status: DC
  Administered 2018-02-28 (×2): 15 mL via OROMUCOSAL

## 2018-02-28 MED ORDER — ORAL CARE MOUTH RINSE
15.0000 mL | OROMUCOSAL | Status: DC
  Administered 2018-02-28 (×5): 15 mL via OROMUCOSAL

## 2018-02-28 MED ORDER — PROPOFOL 1000 MG/100ML IV EMUL
5.0000 ug/kg/min | INTRAVENOUS | Status: DC
  Administered 2018-02-27 – 2018-02-28 (×2): 40 ug/kg/min via INTRAVENOUS
  Filled 2018-02-28 (×2): qty 100

## 2018-02-28 MED FILL — Thrombin For Soln 5000 Unit: CUTANEOUS | Qty: 5000 | Status: AC

## 2018-02-28 MED FILL — Thrombin For Soln 20000 Unit: CUTANEOUS | Qty: 1 | Status: AC

## 2018-02-28 MED FILL — Gelatin Absorbable MT Powder: OROMUCOSAL | Qty: 1 | Status: AC

## 2018-02-28 NOTE — Progress Notes (Signed)
eLink Physician-Brief Progress Note Patient Name: Austin LovettDavan L Coufal DOB: [birth year] MRN: [id number]   Date of Service  02/28/2018  HPI/Events of Note  52 yo bm, sustained presumed hypertensive intracranial bleed, LEFT posterior fossa today    eICU Interventions  1.neuro s/p craniotomy 2.PCCM asked for consult 3.supportive care 4.follow up Neuro recs        [name] 02/28/2018, 12:33 AM

## 2018-02-28 NOTE — Procedures (Signed)
Extubation Procedure Note  Patient Details:   Name: Austin LovettDavan L Journey DOB: Jul 30, 1966 MRN: [id number]   Airway Documentation:  Minimal cuff leak noted prior to extubation- MD aware.   Evaluation  O2 sats: stable throughout Complications: No apparent complications Patient did tolerate procedure well. Bilateral Breath Sounds: Clear, Diminished   Yes pt able to speak, no distress noted.  VSS, RN at bedside.  Jennette KettleBrowning, Bernadett Milian Joy 02/28/2018, 12:23 PM

## 2018-02-28 NOTE — Progress Notes (Signed)
Subjective: Patient  Intubated and sedated but able to open eyes to voice and follow simple commands. Nods head appropriately to questions.   Objective: Vital signs in last 24 hours: Temp:  [97.6 F (36.4 C)-99.3 F (37.4 C)] 99.3 F (37.4 C) (04/11 0745) Pulse Rate:  [75-129] 118 (04/11 0745) Resp:  [16-31] 24 (04/11 0745) BP: (118-210)/(73-129) 140/83 (04/11 0745) SpO2:  [97 %-100 %] 99 % (04/11 0745) Arterial Line BP: (131-192)/(66-98) 141/80 (04/11 0745) FiO2 (%):  [40 %-50 %] 40 % (04/11 0312) Weight:  [104 kg (229 lb 4.5 oz)-104.3 kg (230 lb)] 104 kg (229 lb 4.5 oz) (04/11 0030)  Intake/Output from previous day: 04/10 0701 - 04/11 0700 In: 2462.1 [I.V.:2412.1; IV Piggyback:50] Out: 874 [Urine:700; Drains:74; Blood:100] Intake/Output this shift: No intake/output data recorded.   [facility] and [facility] appropriately  Lab Results: Lab Results  Component Value Date   WBC 17.6 (H) 02/27/2018   HGB 18.4 (H) 02/27/2018   HCT 54.0 (H) 02/27/2018   MCV 85.2 02/27/2018   PLT 232 02/27/2018   Lab Results  Component Value Date   INR 0.98 02/27/2018   BMET Lab Results  Component Value Date   NA 138 02/28/2018   K 4.1 02/28/2018   CL 107 02/28/2018   CO2 19 (L) 02/28/2018   GLUCOSE 115 (H) 02/28/2018   BUN 11 02/28/2018   CREATININE 1.51 (H) 02/28/2018   CALCIUM 8.7 (L) 02/28/2018    Studies/Results: Ct Head Wo Contrast  Result Date: 02/28/2018 CLINICAL DATA:  Follow-up evacuation of cerebellar hemorrhage. EXAM: CT HEAD WITHOUT CONTRAST TECHNIQUE: Contiguous axial images were obtained from the base of the skull through the vertex without intravenous contrast. COMPARISON:  02/27/2018 FINDINGS: Brain: Interval left occipital craniectomy and evacuation of a large left cerebellar hematoma. Hemostasis material along the operative approach. Blood continues to fill the fourth ventricle. Small amount in the third ventricle and lateral ventricles appears similar.  Ventriculostomy catheter in the frontal horn of the right lateral ventricle. Small in frank wall hemorrhage in the right frontal cortical and subcortical brain measuring about 1 x 2 cm in size. No mass effect associated with that. Ventricular size is smaller than on the presentation scan. Vascular: No significant vascular finding. Skull: Otherwise negative Sinuses/Orbits: Clear except for opacification of the left maxillary sinus related to the large blowout fracture. Other: None IMPRESSION: Evacuation of left cerebellar hematoma. No additional posterior fossa bleeding. Intraventricular blood appears about the same. Ventriculostomy tube well positioned with reduction in ventricular size. 1 x 2 cm hemorrhage in the right frontal lobe associated with the ventriculostomy placement. Electronically Signed   By: Paulina FusiMark  Shogry M.D.   On: 02/28/2018 07:29   Ct Head Wo Contrast  Result Date: 02/27/2018 CLINICAL DATA:  Erratically behavior.  Head trauma. EXAM: CT HEAD WITHOUT CONTRAST CT MAXILLOFACIAL WITHOUT CONTRAST CT CERVICAL SPINE WITHOUT CONTRAST TECHNIQUE: Multidetector CT imaging of the head, cervical spine, and maxillofacial structures were performed using the standard protocol without intravenous contrast. Multiplanar CT image reconstructions of the cervical spine and maxillofacial structures were also generated. COMPARISON:  None. FINDINGS: CT HEAD FINDINGS Brain: Acute intraparenchymal hemorrhage in the left cerebellum. Hematoma measures 4.5 x 3.7 x 2.6 cm. (volume = 23). Intraventricular penetration with filling of the fourth ventricle and some reflux into the third and lateral ventricles. Early ventricular fullness/hydrocephalus. This hemorrhages probably a hypertensive hemorrhage. The cerebral hemispheres and cells appear negative. Vascular: No abnormal vascular finding. Skull: Negative Other: Facial fracture as below.  CT MAXILLOFACIAL FINDINGS Osseous: Orbital floor blowout fracture on the left containing  fat. Some potential for trapping of the inferior rectus muscle. Fragments are displaced into the maxillary sinus along with a good bit of orbital fat. Fracture of the medial wall the maxillary sinus, bulging into the lateral left nasal passages. Orbits: No evidence of globe disruption. Pre and postseptal hemorrhage. No evidence of confluent postseptal hematoma. Sinuses: Clear other than the traumatic findings of the left maxillary sinus. Soft tissues: Otherwise negative. CT CERVICAL SPINE FINDINGS Alignment: Motion degraded study.  Alignment appears normal. Skull base and vertebrae: No evidence of fracture.  Motion degraded. Soft tissues and spinal canal: Negative Disc levels:  No significant degenerative changes. Upper chest: Not included. Other: None IMPRESSION: Acute intraparenchymal hemorrhage in the left cerebellar hemisphere with hematoma volume 23 cc. Intraventricular penetration with developing hydrocephalus.These results were called by telephone at the time of interpretation on 02/27/2018 at 7:12 pm to Dr. Melene Plan , who verbally acknowledged these results. Large orbital floor blowout fracture on the left with displaced fragments into the maxillary sinus. Large amount of herniated orbital fat, with considerable potential for disruption of the biomechanics of the extraocular musculature. This may require surgical reduction. Fracture of the medial wall of the maxillary sinus bulging into the lateral left nasal passages. Motion degraded cervical CT not showing any abnormality. Electronically Signed   By: Paulina Fusi M.D.   On: 02/27/2018 19:20   Ct Chest W Contrast  Result Date: 02/27/2018 CLINICAL DATA:  Patient was having nausea, vomiting and diarrhea this morning and was found by wife in the bathroom having his head on bathtub. Hypertension. Blunt abdominal trauma. EXAM: CT CHEST, ABDOMEN, AND PELVIS WITH CONTRAST TECHNIQUE: Multidetector CT imaging of the chest, abdomen and pelvis was performed  following the standard protocol during bolus administration of intravenous contrast. CONTRAST:  ISOVUE-300 IOPAMIDOL (ISOVUE-300) INJECTION 61% COMPARISON:  CXR 12/02/2005 FINDINGS: CT CHEST FINDINGS Cardiovascular: No mediastinal hematoma. Normal size heart with coronary arteriosclerosis. Minimal aortic atherosclerosis without aneurysm or dissection. No large central pulmonary embolus. Mediastinum/Nodes: No mediastinal adenopathy. No dominant mass of the included thyroid nor thyromegaly. Patent midline trachea and mainstem bronchi. Unremarkable CT appearance of the esophagus. Lungs/Pleura: No pneumothorax or effusion. No pulmonary consolidation. The respiratory motion artifacts at the lung bases limit assessment. Musculoskeletal: No acute fracture. CT ABDOMEN PELVIS FINDINGS Hepatobiliary: No focal liver abnormality is seen. No gallstones, gallbladder wall thickening, or biliary dilatation. Pancreas: Unremarkable. No pancreatic ductal dilatation or surrounding inflammatory changes. Spleen: No splenic injury or perisplenic hematoma. Adrenals/Urinary Tract: Adrenal glands are unremarkable. A 7.1 cm simple cyst is noted off the interpolar left kidney. The right kidney is unremarkable. No nephrolithiasis. No hydroureteronephrosis. Decompressed urinary bladder without focal mural thickening or calculus. Stomach/Bowel: Small hiatal hernia. Nondistended stomach with normal small bowel rotation. No bowel obstruction or inflammation. Normal appendix. Submucosal fat deposition compatible with chronic inflammatory bowel is identified of the colon and rectum with diffuse colonic spasm currently from cecum through distal descending colon. Vascular/Lymphatic: Mild aortoiliac atherosclerosis without adenopathy. Reproductive: Normal size prostate and seminal vesicles. Other: No free air nor free fluid. Musculoskeletal: No acute or significant osseous findings. Left acetabular bone island. IMPRESSION: 1. Coronary  arteriosclerosis.  No active pulmonary disease. 2. No acute solid nor hollow visceral organ abnormality. 3. Submucosal fat deposition within the colon from cecum through descending colon as well as rectum compatible with changes of chronic inflammatory bowel. No active inflammatory process. Diffuse colonic spasm is seen  currently. Electronically Signed   By: Tollie Eth M.D.   On: 02/27/2018 19:42   Ct Cervical Spine Wo Contrast  Result Date: 02/27/2018 CLINICAL DATA:  Erratically behavior.  Head trauma. EXAM: CT HEAD WITHOUT CONTRAST CT MAXILLOFACIAL WITHOUT CONTRAST CT CERVICAL SPINE WITHOUT CONTRAST TECHNIQUE: Multidetector CT imaging of the head, cervical spine, and maxillofacial structures were performed using the standard protocol without intravenous contrast. Multiplanar CT image reconstructions of the cervical spine and maxillofacial structures were also generated. COMPARISON:  None. FINDINGS: CT HEAD FINDINGS Brain: Acute intraparenchymal hemorrhage in the left cerebellum. Hematoma measures 4.5 x 3.7 x 2.6 cm. (volume = 23). Intraventricular penetration with filling of the fourth ventricle and some reflux into the third and lateral ventricles. Early ventricular fullness/hydrocephalus. This hemorrhages probably a hypertensive hemorrhage. The cerebral hemispheres and cells appear negative. Vascular: No abnormal vascular finding. Skull: Negative Other: Facial fracture as below. CT MAXILLOFACIAL FINDINGS Osseous: Orbital floor blowout fracture on the left containing fat. Some potential for trapping of the inferior rectus muscle. Fragments are displaced into the maxillary sinus along with a good bit of orbital fat. Fracture of the medial wall the maxillary sinus, bulging into the lateral left nasal passages. Orbits: No evidence of globe disruption. Pre and postseptal hemorrhage. No evidence of confluent postseptal hematoma. Sinuses: Clear other than the traumatic findings of the left maxillary sinus. Soft  tissues: Otherwise negative. CT CERVICAL SPINE FINDINGS Alignment: Motion degraded study.  Alignment appears normal. Skull base and vertebrae: No evidence of fracture.  Motion degraded. Soft tissues and spinal canal: Negative Disc levels:  No significant degenerative changes. Upper chest: Not included. Other: None IMPRESSION: Acute intraparenchymal hemorrhage in the left cerebellar hemisphere with hematoma volume 23 cc. Intraventricular penetration with developing hydrocephalus.These results were called by telephone at the time of interpretation on 02/27/2018 at 7:12 pm to Dr. Melene Plan , who verbally acknowledged these results. Large orbital floor blowout fracture on the left with displaced fragments into the maxillary sinus. Large amount of herniated orbital fat, with considerable potential for disruption of the biomechanics of the extraocular musculature. This may require surgical reduction. Fracture of the medial wall of the maxillary sinus bulging into the lateral left nasal passages. Motion degraded cervical CT not showing any abnormality. Electronically Signed   By: Paulina Fusi M.D.   On: 02/27/2018 19:20   Ct Abdomen Pelvis W Contrast  Result Date: 02/27/2018 CLINICAL DATA:  Patient was having nausea, vomiting and diarrhea this morning and was found by wife in the bathroom having his head on bathtub. Hypertension. Blunt abdominal trauma. EXAM: CT CHEST, ABDOMEN, AND PELVIS WITH CONTRAST TECHNIQUE: Multidetector CT imaging of the chest, abdomen and pelvis was performed following the standard protocol during bolus administration of intravenous contrast. CONTRAST:  ISOVUE-300 IOPAMIDOL (ISOVUE-300) INJECTION 61% COMPARISON:  CXR 12/02/2005 FINDINGS: CT CHEST FINDINGS Cardiovascular: No mediastinal hematoma. Normal size heart with coronary arteriosclerosis. Minimal aortic atherosclerosis without aneurysm or dissection. No large central pulmonary embolus. Mediastinum/Nodes: No mediastinal adenopathy. No  dominant mass of the included thyroid nor thyromegaly. Patent midline trachea and mainstem bronchi. Unremarkable CT appearance of the esophagus. Lungs/Pleura: No pneumothorax or effusion. No pulmonary consolidation. The respiratory motion artifacts at the lung bases limit assessment. Musculoskeletal: No acute fracture. CT ABDOMEN PELVIS FINDINGS Hepatobiliary: No focal liver abnormality is seen. No gallstones, gallbladder wall thickening, or biliary dilatation. Pancreas: Unremarkable. No pancreatic ductal dilatation or surrounding inflammatory changes. Spleen: No splenic injury or perisplenic hematoma. Adrenals/Urinary Tract: Adrenal glands are unremarkable. A  7.1 cm simple cyst is noted off the interpolar left kidney. The right kidney is unremarkable. No nephrolithiasis. No hydroureteronephrosis. Decompressed urinary bladder without focal mural thickening or calculus. Stomach/Bowel: Small hiatal hernia. Nondistended stomach with normal small bowel rotation. No bowel obstruction or inflammation. Normal appendix. Submucosal fat deposition compatible with chronic inflammatory bowel is identified of the colon and rectum with diffuse colonic spasm currently from cecum through distal descending colon. Vascular/Lymphatic: Mild aortoiliac atherosclerosis without adenopathy. Reproductive: Normal size prostate and seminal vesicles. Other: No free air nor free fluid. Musculoskeletal: No acute or significant osseous findings. Left acetabular bone island. IMPRESSION: 1. Coronary arteriosclerosis.  No active pulmonary disease. 2. No acute solid nor hollow visceral organ abnormality. 3. Submucosal fat deposition within the colon from cecum through descending colon as well as rectum compatible with changes of chronic inflammatory bowel. No active inflammatory process. Diffuse colonic spasm is seen currently. Electronically Signed   By: Tollie Eth M.D.   On: 02/27/2018 19:42   Dg Chest Port 1 View  Result Date:  02/28/2018 CLINICAL DATA:  Intubation and orogastric tube placement. EXAM: PORTABLE CHEST 1 VIEW COMPARISON:  Chest CT earlier this day. FINDINGS: Endotracheal tube 2.5 cm from the carina. Enteric tube in place with tip below the diaphragm. Side-port is not confidently visualized. Left subclavian central venous catheter tip in the midline in the region of the brachiocephalic vein. Borderline cardiomegaly with normal mediastinal contours. Mild right basilar atelectasis. No pulmonary edema or pleural effusion. No pneumothorax. IMPRESSION: 1. Endotracheal tube 2.5 cm from the carina. Enteric tube in place with tip below the diaphragm. 2. Left subclavian central line tip in the region of the distal brachiocephalic vein. No pneumothorax. 3. Right basilar atelectasis. Electronically Signed   By: Rubye Oaks M.D.   On: 02/28/2018 00:33   Ct Maxillofacial Wo Contrast  Result Date: 02/27/2018 CLINICAL DATA:  Erratically behavior.  Head trauma. EXAM: CT HEAD WITHOUT CONTRAST CT MAXILLOFACIAL WITHOUT CONTRAST CT CERVICAL SPINE WITHOUT CONTRAST TECHNIQUE: Multidetector CT imaging of the head, cervical spine, and maxillofacial structures were performed using the standard protocol without intravenous contrast. Multiplanar CT image reconstructions of the cervical spine and maxillofacial structures were also generated. COMPARISON:  None. FINDINGS: CT HEAD FINDINGS Brain: Acute intraparenchymal hemorrhage in the left cerebellum. Hematoma measures 4.5 x 3.7 x 2.6 cm. (volume = 23). Intraventricular penetration with filling of the fourth ventricle and some reflux into the third and lateral ventricles. Early ventricular fullness/hydrocephalus. This hemorrhages probably a hypertensive hemorrhage. The cerebral hemispheres and cells appear negative. Vascular: No abnormal vascular finding. Skull: Negative Other: Facial fracture as below. CT MAXILLOFACIAL FINDINGS Osseous: Orbital floor blowout fracture on the left containing fat.  Some potential for trapping of the inferior rectus muscle. Fragments are displaced into the maxillary sinus along with a good bit of orbital fat. Fracture of the medial wall the maxillary sinus, bulging into the lateral left nasal passages. Orbits: No evidence of globe disruption. Pre and postseptal hemorrhage. No evidence of confluent postseptal hematoma. Sinuses: Clear other than the traumatic findings of the left maxillary sinus. Soft tissues: Otherwise negative. CT CERVICAL SPINE FINDINGS Alignment: Motion degraded study.  Alignment appears normal. Skull base and vertebrae: No evidence of fracture.  Motion degraded. Soft tissues and spinal canal: Negative Disc levels:  No significant degenerative changes. Upper chest: Not included. Other: None IMPRESSION: Acute intraparenchymal hemorrhage in the left cerebellar hemisphere with hematoma volume 23 cc. Intraventricular penetration with developing hydrocephalus.These results were called by telephone at the  time of interpretation on 02/27/2018 at 7:12 pm to Dr. Melene Plan , who verbally acknowledged these results. Large orbital floor blowout fracture on the left with displaced fragments into the maxillary sinus. Large amount of herniated orbital fat, with considerable potential for disruption of the biomechanics of the extraocular musculature. This may require surgical reduction. Fracture of the medial wall of the maxillary sinus bulging into the lateral left nasal passages. Motion degraded cervical CT not showing any abnormality. Electronically Signed   By: Paulina Fusi M.D.   On: 02/27/2018 19:20    Assessment/Plan: 52 year old post op day 1 from suboccipital crani. Still sedated and intubated. Will try to wean patient off of sedation and extubate per CCM today. BP goal still less than 160, cardene gtt. Follow up CT head this morning stable. Continue IVC drain   LOS: 1 day    Tiana Loft Abdullah Rizzi 02/28/2018, 7:59 AM

## 2018-02-28 NOTE — Addendum Note (Signed)
Addendum  created 02/28/18 2024 by Kipp BroodJoslin, Masayuki Sakai, MD   Actions taken from a BestPractice Advisory

## 2018-02-28 NOTE — Consult Note (Signed)
PULMONARY / CRITICAL CARE MEDICINE   Name: Austin Leonard MRN: [id number] DOB: 29-Jan-1966    ADMISSION DATE:  02/27/2018 CONSULTATION DATE:  02/27/18  REFERRING MD:  Yetta Barre  CHIEF COMPLAINT:  Post op management  HISTORY OF PRESENT ILLNESS:  Pt is encephelopathic; therefore, this HPI is obtained from chart review. Austin Leonard is a 52 y.o. male with PMH as outlined below. He had a fall 4/10 in bathroom at home and might have struck his face on the bathtub after having dizziness and headache earlier that day. Evaluation in ED included CT head that demonstrated a left ICH, left medial orbital blow out fx including fx of medial wall of maxillary sinus, herniation of orbital fat, left lateral upper eyelid laceration.  He was taken to the OR and had right frontal ventriculostomy drain placement, suboccipital craniectomy for evacuation of left cerebellar hematoma.  He also had left eyelid repaired by ENT.  He then returned to the ICU on the vent and PCCM was asked to assist with vent management.  02/28/18 The [atient is on the ventiator. He is awake  and responsive. He has been on SBT PS 5 and has excellent TVs. BP is running a little high at present. 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 Has an EVD for hydrocephalus and Increased ICP  PAST MEDICAL HISTORY :  He  has a past medical history of Hypertension.  PAST SURGICAL HISTORY: He  has no past surgical history on file.  No Known Allergies  No current facility-administered medications on file prior to encounter.    Current Outpatient Medications on File Prior to Encounter  Medication Sig  . acetaminophen (TYLENOL) 500 MG tablet Take 1,000 mg by mouth every 8 (eight) hours as needed (pain).  . hydrochlorothiazide (HYDRODIURIL) 25 MG tablet Take 1 tablet (25 mg total) by mouth daily.  Marland Kitchen ibuprofen (ADVIL,MOTRIN) 200 MG tablet Take 600 mg by mouth every 6 (six) hours as needed (pain).   . indapamide (LOZOL) 1.25 MG tablet Take 1.25 mg by mouth daily.  . nebivolol (BYSTOLIC) 5 MG tablet Take 1 tablet (5 mg total) by mouth daily.  . rosuvastatin (CRESTOR) 10 MG tablet Take 10 mg by mouth daily.    FAMILY HISTORY:  His has no family status information on file.    SOCIAL HISTORY: He  has an unknown smoking status. He has never used smokeless tobacco. He reports that he drinks alcohol. He reports that he does not use drugs.     VITAL SIGNS: BP 133/86   Pulse (!) 117   Temp 98.8 F (37.1 C)   Resp (!) 24   Ht 5\' 6"  (1.676 m)   Wt 229 lb 4.5 oz (104 kg)   SpO2 100%   BMI 37.01 kg/m   HEMODYNAMICS:    VENTILATOR SETTINGS: Vent Mode: PSV;CPAP FiO2 (%):  [40 %-50 %] 40 % Set Rate:  [18 bmp] 18 bmp Vt Set:  [510 mL] 510 mL PEEP:  [5 cmH20] 5 cmH20 Pressure Support:  [5 cmH20] 5 cmH20 Plateau Pressure:  [18 cmH20] 18 cmH20  INTAKE / OUTPUT: I/O last 3 completed shifts: In: 2462.1 [I.V.:2412.1; IV Piggyback:50] Out: 874 [Urine:700; Drains:74; Blood:100]   PHYSICAL EXAMINATION: General: Adult male, in NAD. Neuro: Sedated, non-responsive. HEENT: Scalp dressings C/D/I, ventric drain in place.  Sclerae anicteric. Cardiovascular: RRR, no M/R/G.  Lungs: Respirations even and unlabored.  CTA bilaterally, No W/R/R. Abdomen: BS x 4, soft, NT/ND.  Musculoskeletal: No gross deformities, no edema.  Skin: Intact, warm, no rashes.  LABS:  BMET Recent Labs  Lab 02/27/18 1858 02/27/18 1908 02/28/18 0527  NA 137 138 138  K 4.1 3.9 4.1  CL 101 103 107  CO2 19*  --  19*  BUN 9 11 11   CREATININE 1.33* 1.20 1.51*  GLUCOSE 131* 141* 115*    Electrolytes Recent Labs  Lab 02/27/18 1858 02/28/18 0527  CALCIUM 9.7 8.7*    CBC Recent Labs  Lab 02/27/18 1858 02/27/18 1908  WBC 17.6*  --   HGB 16.4 18.4*  HCT 49.4 54.0*  PLT 232  --     Coag's Recent Labs  Lab 02/27/18 1858  INR 0.98    Sepsis Markers Recent Labs  Lab 02/27/18 1909  02/27/18 2327  LATICACIDVEN 4.87* 3.7*    ABG Recent Labs  Lab 02/28/18 0021  PHART 7.265*  PCO2ART 39.7  PO2ART 126.0*    Liver Enzymes Recent Labs  Lab 02/27/18 1858  AST 46*  ALT 26  ALKPHOS 88  BILITOT 0.9  ALBUMIN 4.6    Cardiac Enzymes No results for input(s): TROPONINI, PROBNP in the last 168 hours.  Glucose No results for input(s): GLUCAP in the last 168 hours.  Imaging Ct Head Wo Contrast  Result Date: 02/28/2018 CLINICAL DATA:  Follow-up evacuation of cerebellar hemorrhage. EXAM: CT HEAD WITHOUT CONTRAST TECHNIQUE: Contiguous axial images were obtained from the base of the skull through the vertex without intravenous contrast. COMPARISON:  02/27/2018 FINDINGS: Brain: Interval left occipital craniectomy and evacuation of a large left cerebellar hematoma. Hemostasis material along the operative approach. Blood continues to fill the fourth ventricle. Small amount in the third ventricle and lateral ventricles appears similar. Ventriculostomy catheter in the frontal horn of the right lateral ventricle. Small in frank wall hemorrhage in the right frontal cortical and subcortical brain measuring about 1 x 2 cm in size. No mass effect associated with that. Ventricular size is smaller than on the presentation scan. Vascular: No significant vascular finding. Skull: Otherwise negative Sinuses/Orbits: Clear except for opacification of the left maxillary sinus related to the large blowout fracture. Other: None IMPRESSION: Evacuation of left cerebellar hematoma. No additional posterior fossa bleeding. Intraventricular blood appears about the same. Ventriculostomy tube well positioned with reduction in ventricular size. 1 x 2 cm hemorrhage in the right frontal lobe associated with the ventriculostomy placement. Electronically Signed   By: Paulina FusiMark  Shogry M.D.   On: 02/28/2018 07:29   Ct Head Wo Contrast  Result Date: 02/27/2018 CLINICAL DATA:  Erratically behavior.  Head trauma. EXAM:  CT HEAD WITHOUT CONTRAST CT MAXILLOFACIAL WITHOUT CONTRAST CT CERVICAL SPINE WITHOUT CONTRAST TECHNIQUE: Multidetector CT imaging of the head, cervical spine, and maxillofacial structures were performed using the standard protocol without intravenous contrast. Multiplanar CT image reconstructions of the cervical spine and maxillofacial structures were also generated. COMPARISON:  None. FINDINGS: CT HEAD FINDINGS Brain: Acute intraparenchymal hemorrhage in the left cerebellum. Hematoma measures 4.5 x 3.7 x 2.6 cm. (volume = 23). Intraventricular penetration with filling of the fourth ventricle and some reflux into the third and lateral ventricles. Early ventricular fullness/hydrocephalus. This hemorrhages probably a hypertensive hemorrhage. The cerebral hemispheres and cells appear negative. Vascular: No abnormal vascular finding. Skull: Negative Other: Facial fracture as below. CT MAXILLOFACIAL FINDINGS Osseous: Orbital floor blowout fracture on the left containing fat. Some potential for trapping of the inferior rectus muscle. Fragments are displaced into the maxillary sinus along with a good bit of orbital  fat. Fracture of the medial wall the maxillary sinus, bulging into the lateral left nasal passages. Orbits: No evidence of globe disruption. Pre and postseptal hemorrhage. No evidence of confluent postseptal hematoma. Sinuses: Clear other than the traumatic findings of the left maxillary sinus. Soft tissues: Otherwise negative. CT CERVICAL SPINE FINDINGS Alignment: Motion degraded study.  Alignment appears normal. Skull base and vertebrae: No evidence of fracture.  Motion degraded. Soft tissues and spinal canal: Negative Disc levels:  No significant degenerative changes. Upper chest: Not included. Other: None IMPRESSION: Acute intraparenchymal hemorrhage in the left cerebellar hemisphere with hematoma volume 23 cc. Intraventricular penetration with developing hydrocephalus.These results were called by telephone  at the time of interpretation on 02/27/2018 at 7:12 pm to Dr. Melene Plan , who verbally acknowledged these results. Large orbital floor blowout fracture on the left with displaced fragments into the maxillary sinus. Large amount of herniated orbital fat, with considerable potential for disruption of the biomechanics of the extraocular musculature. This may require surgical reduction. Fracture of the medial wall of the maxillary sinus bulging into the lateral left nasal passages. Motion degraded cervical CT not showing any abnormality. Electronically Signed   By: Paulina Fusi M.D.   On: 02/27/2018 19:20   Ct Chest W Contrast  Result Date: 02/27/2018 CLINICAL DATA:  Patient was having nausea, vomiting and diarrhea this morning and was found by wife in the bathroom having his head on bathtub. Hypertension. Blunt abdominal trauma. EXAM: CT CHEST, ABDOMEN, AND PELVIS WITH CONTRAST TECHNIQUE: Multidetector CT imaging of the chest, abdomen and pelvis was performed following the standard protocol during bolus administration of intravenous contrast. CONTRAST:  ISOVUE-300 IOPAMIDOL (ISOVUE-300) INJECTION 61% COMPARISON:  CXR 12/02/2005 FINDINGS: CT CHEST FINDINGS Cardiovascular: No mediastinal hematoma. Normal size heart with coronary arteriosclerosis. Minimal aortic atherosclerosis without aneurysm or dissection. No large central pulmonary embolus. Mediastinum/Nodes: No mediastinal adenopathy. No dominant mass of the included thyroid nor thyromegaly. Patent midline trachea and mainstem bronchi. Unremarkable CT appearance of the esophagus. Lungs/Pleura: No pneumothorax or effusion. No pulmonary consolidation. The respiratory motion artifacts at the lung bases limit assessment. Musculoskeletal: No acute fracture. CT ABDOMEN PELVIS FINDINGS Hepatobiliary: No focal liver abnormality is seen. No gallstones, gallbladder wall thickening, or biliary dilatation. Pancreas: Unremarkable. No pancreatic ductal dilatation or  surrounding inflammatory changes. Spleen: No splenic injury or perisplenic hematoma. Adrenals/Urinary Tract: Adrenal glands are unremarkable. A 7.1 cm simple cyst is noted off the interpolar left kidney. The right kidney is unremarkable. No nephrolithiasis. No hydroureteronephrosis. Decompressed urinary bladder without focal mural thickening or calculus. Stomach/Bowel: Small hiatal hernia. Nondistended stomach with normal small bowel rotation. No bowel obstruction or inflammation. Normal appendix. Submucosal fat deposition compatible with chronic inflammatory bowel is identified of the colon and rectum with diffuse colonic spasm currently from cecum through distal descending colon. Vascular/Lymphatic: Mild aortoiliac atherosclerosis without adenopathy. Reproductive: Normal size prostate and seminal vesicles. Other: No free air nor free fluid. Musculoskeletal: No acute or significant osseous findings. Left acetabular bone island. IMPRESSION: 1. Coronary arteriosclerosis.  No active pulmonary disease. 2. No acute solid nor hollow visceral organ abnormality. 3. Submucosal fat deposition within the colon from cecum through descending colon as well as rectum compatible with changes of chronic inflammatory bowel. No active inflammatory process. Diffuse colonic spasm is seen currently. Electronically Signed   By: Tollie Eth M.D.   On: 02/27/2018 19:42   Ct Cervical Spine Wo Contrast  Result Date: 02/27/2018 CLINICAL DATA:  Erratically behavior.  Head trauma. EXAM: CT HEAD  WITHOUT CONTRAST CT MAXILLOFACIAL WITHOUT CONTRAST CT CERVICAL SPINE WITHOUT CONTRAST TECHNIQUE: Multidetector CT imaging of the head, cervical spine, and maxillofacial structures were performed using the standard protocol without intravenous contrast. Multiplanar CT image reconstructions of the cervical spine and maxillofacial structures were also generated. COMPARISON:  None. FINDINGS: CT HEAD FINDINGS Brain: Acute intraparenchymal hemorrhage in  the left cerebellum. Hematoma measures 4.5 x 3.7 x 2.6 cm. (volume = 23). Intraventricular penetration with filling of the fourth ventricle and some reflux into the third and lateral ventricles. Early ventricular fullness/hydrocephalus. This hemorrhages probably a hypertensive hemorrhage. The cerebral hemispheres and cells appear negative. Vascular: No abnormal vascular finding. Skull: Negative Other: Facial fracture as below. CT MAXILLOFACIAL FINDINGS Osseous: Orbital floor blowout fracture on the left containing fat. Some potential for trapping of the inferior rectus muscle. Fragments are displaced into the maxillary sinus along with a good bit of orbital fat. Fracture of the medial wall the maxillary sinus, bulging into the lateral left nasal passages. Orbits: No evidence of globe disruption. Pre and postseptal hemorrhage. No evidence of confluent postseptal hematoma. Sinuses: Clear other than the traumatic findings of the left maxillary sinus. Soft tissues: Otherwise negative. CT CERVICAL SPINE FINDINGS Alignment: Motion degraded study.  Alignment appears normal. Skull base and vertebrae: No evidence of fracture.  Motion degraded. Soft tissues and spinal canal: Negative Disc levels:  No significant degenerative changes. Upper chest: Not included. Other: None IMPRESSION: Acute intraparenchymal hemorrhage in the left cerebellar hemisphere with hematoma volume 23 cc. Intraventricular penetration with developing hydrocephalus.These results were called by telephone at the time of interpretation on 02/27/2018 at 7:12 pm to Dr. Melene Plan , who verbally acknowledged these results. Large orbital floor blowout fracture on the left with displaced fragments into the maxillary sinus. Large amount of herniated orbital fat, with considerable potential for disruption of the biomechanics of the extraocular musculature. This may require surgical reduction. Fracture of the medial wall of the maxillary sinus bulging into the lateral  left nasal passages. Motion degraded cervical CT not showing any abnormality. Electronically Signed   By: Paulina Fusi M.D.   On: 02/27/2018 19:20   Ct Abdomen Pelvis W Contrast  Result Date: 02/27/2018 CLINICAL DATA:  Patient was having nausea, vomiting and diarrhea this morning and was found by wife in the bathroom having his head on bathtub. Hypertension. Blunt abdominal trauma. EXAM: CT CHEST, ABDOMEN, AND PELVIS WITH CONTRAST TECHNIQUE: Multidetector CT imaging of the chest, abdomen and pelvis was performed following the standard protocol during bolus administration of intravenous contrast. CONTRAST:  ISOVUE-300 IOPAMIDOL (ISOVUE-300) INJECTION 61% COMPARISON:  CXR 12/02/2005 FINDINGS: CT CHEST FINDINGS Cardiovascular: No mediastinal hematoma. Normal size heart with coronary arteriosclerosis. Minimal aortic atherosclerosis without aneurysm or dissection. No large central pulmonary embolus. Mediastinum/Nodes: No mediastinal adenopathy. No dominant mass of the included thyroid nor thyromegaly. Patent midline trachea and mainstem bronchi. Unremarkable CT appearance of the esophagus. Lungs/Pleura: No pneumothorax or effusion. No pulmonary consolidation. The respiratory motion artifacts at the lung bases limit assessment. Musculoskeletal: No acute fracture. CT ABDOMEN PELVIS FINDINGS Hepatobiliary: No focal liver abnormality is seen. No gallstones, gallbladder wall thickening, or biliary dilatation. Pancreas: Unremarkable. No pancreatic ductal dilatation or surrounding inflammatory changes. Spleen: No splenic injury or perisplenic hematoma. Adrenals/Urinary Tract: Adrenal glands are unremarkable. A 7.1 cm simple cyst is noted off the interpolar left kidney. The right kidney is unremarkable. No nephrolithiasis. No hydroureteronephrosis. Decompressed urinary bladder without focal mural thickening or calculus. Stomach/Bowel: Small hiatal hernia. Nondistended stomach with normal  small bowel rotation. No bowel  obstruction or inflammation. Normal appendix. Submucosal fat deposition compatible with chronic inflammatory bowel is identified of the colon and rectum with diffuse colonic spasm currently from cecum through distal descending colon. Vascular/Lymphatic: Mild aortoiliac atherosclerosis without adenopathy. Reproductive: Normal size prostate and seminal vesicles. Other: No free air nor free fluid. Musculoskeletal: No acute or significant osseous findings. Left acetabular bone island. IMPRESSION: 1. Coronary arteriosclerosis.  No active pulmonary disease. 2. No acute solid nor hollow visceral organ abnormality. 3. Submucosal fat deposition within the colon from cecum through descending colon as well as rectum compatible with changes of chronic inflammatory bowel. No active inflammatory process. Diffuse colonic spasm is seen currently. Electronically Signed   By: Tollie Eth M.D.   On: 02/27/2018 19:42   Dg Chest Port 1 View  Result Date: 02/28/2018 CLINICAL DATA:  Intubation and orogastric tube placement. EXAM: PORTABLE CHEST 1 VIEW COMPARISON:  Chest CT earlier this day. FINDINGS: Endotracheal tube 2.5 cm from the carina. Enteric tube in place with tip below the diaphragm. Side-port is not confidently visualized. Left subclavian central venous catheter tip in the midline in the region of the brachiocephalic vein. Borderline cardiomegaly with normal mediastinal contours. Mild right basilar atelectasis. No pulmonary edema or pleural effusion. No pneumothorax. IMPRESSION: 1. Endotracheal tube 2.5 cm from the carina. Enteric tube in place with tip below the diaphragm. 2. Left subclavian central line tip in the region of the distal brachiocephalic vein. No pneumothorax. 3. Right basilar atelectasis. Electronically Signed   By: Rubye Oaks M.D.   On: 02/28/2018 00:33   Ct Maxillofacial Wo Contrast  Result Date: 02/27/2018 CLINICAL DATA:  Erratically behavior.  Head trauma. EXAM: CT HEAD WITHOUT CONTRAST CT  MAXILLOFACIAL WITHOUT CONTRAST CT CERVICAL SPINE WITHOUT CONTRAST TECHNIQUE: Multidetector CT imaging of the head, cervical spine, and maxillofacial structures were performed using the standard protocol without intravenous contrast. Multiplanar CT image reconstructions of the cervical spine and maxillofacial structures were also generated. COMPARISON:  None. FINDINGS: CT HEAD FINDINGS Brain: Acute intraparenchymal hemorrhage in the left cerebellum. Hematoma measures 4.5 x 3.7 x 2.6 cm. (volume = 23). Intraventricular penetration with filling of the fourth ventricle and some reflux into the third and lateral ventricles. Early ventricular fullness/hydrocephalus. This hemorrhages probably a hypertensive hemorrhage. The cerebral hemispheres and cells appear negative. Vascular: No abnormal vascular finding. Skull: Negative Other: Facial fracture as below. CT MAXILLOFACIAL FINDINGS Osseous: Orbital floor blowout fracture on the left containing fat. Some potential for trapping of the inferior rectus muscle. Fragments are displaced into the maxillary sinus along with a good bit of orbital fat. Fracture of the medial wall the maxillary sinus, bulging into the lateral left nasal passages. Orbits: No evidence of globe disruption. Pre and postseptal hemorrhage. No evidence of confluent postseptal hematoma. Sinuses: Clear other than the traumatic findings of the left maxillary sinus. Soft tissues: Otherwise negative. CT CERVICAL SPINE FINDINGS Alignment: Motion degraded study.  Alignment appears normal. Skull base and vertebrae: No evidence of fracture.  Motion degraded. Soft tissues and spinal canal: Negative Disc levels:  No significant degenerative changes. Upper chest: Not included. Other: None IMPRESSION: Acute intraparenchymal hemorrhage in the left cerebellar hemisphere with hematoma volume 23 cc. Intraventricular penetration with developing hydrocephalus.These results were called by telephone at the time of  interpretation on 02/27/2018 at 7:12 pm to Dr. Melene Plan , who verbally acknowledged these results. Large orbital floor blowout fracture on the left with displaced fragments into the maxillary sinus. Large amount of  herniated orbital fat, with considerable potential for disruption of the biomechanics of the extraocular musculature. This may require surgical reduction. Fracture of the medial wall of the maxillary sinus bulging into the lateral left nasal passages. Motion degraded cervical CT not showing any abnormality. Electronically Signed   By: Paulina Fusi M.D.   On: 02/27/2018 19:20     STUDIES:  CT head 4/10 > acute IPH in left cerebellar hemisphere, intraventricular penetration with developing hydrocephalus.  Large orbital floor blowout fx on left with displaced fragments into maxillary sinus, large amount of herniated orbital fat, fx of medial wall of maxillary sinus. CT chest / abd / pelv 4/10 > no acute process.    SIGNIFICANT EVENTS: 4/10 > admit, to OR for right frontal ventriculostomy drain placement, suboccipital craniectomy for evacuation of left cerebellar hematoma. Also seen by ENT for left eyelid laceration closure.  LINES/TUBES: ETT 4/10 >  Art line 4/10 >  DISCUSSION: 52 y.o. male admitted 4/10 with left ICH s/p craniectomy and ventric drain placement.  Returned to ICU on vent and PCCM asked to assist with vent management.  ASSESSMENT / PLAN:  PULMONARY A: Respiratory insufficiency - remains intubated post op.  the [patient appears to be weaning well. I believe we will be able to extubate him sjhortly   CARDIOVASCULAR A:  Hypertensive emergency. P:  Continue cardene, goal SBP 160's per neuro. Continue labetalol PRN.  RENAL  The patient has GFR about 60,. Unsure if this is old or new. Urine output acceptable  GASTROINTESTINAL A:   GI prophylaxis. Nutrition. P:   SUP: Pantoprazole. NPO.  HEMATOLOGIC A:   VTE Prophylaxis. P:  SCD's. CBC in  AM.  INEUROLOGIC A:   Left ICH - s/p craniectomy and ventric drain placement. Sedation needs due to mechanical ventilation. Patient responsive  Post op care per neurosurgery. Continue decadron per neurosurgery. Sedation:  Propofol gtt / Fentanyl PRN. RASS goal: 0 to -1. Await neurosurgery f/u.   HEENT: A: Left eyelid laceration - s/p repair in OR. Left orbital blow out fx, medial maxially fx. P: ENT following. Will need ophthalmology consult once recovers from this event. Might require ORIF of orbital blow out fx.

## 2018-02-28 NOTE — Consult Note (Signed)
PULMONARY / CRITICAL CARE MEDICINE   Name: Austin Leonard MRN: [id number] DOB: [birth year]    ADMISSION DATE:  02/27/2018 CONSULTATION DATE:  02/27/18  REFERRING MD:  Yetta Barre  CHIEF COMPLAINT:  Post op management  HISTORY OF PRESENT ILLNESS:  Pt is encephelopathic; therefore, this HPI is obtained from chart review. Austin Leonard is a 52 y.o. male with PMH as outlined below. He had a fall 4/10 in bathroom at home and might have struck his face on the bathtub after having dizziness and headache earlier that day. Evaluation in ED included CT head that demonstrated a left ICH, left medial orbital blow out fx including fx of medial wall of maxillary sinus, herniation of orbital fat, left lateral upper eyelid laceration.  He was taken to the OR and had right frontal ventriculostomy drain placement, suboccipital craniectomy for evacuation of left cerebellar hematoma.  He also had left eyelid repaired by ENT.  He then returned to the ICU on the vent and PCCM was asked to assist with vent management.  PAST MEDICAL HISTORY :  He  has a past medical history of Hypertension.  PAST SURGICAL HISTORY: He  has no past surgical history on file.  No Known Allergies  No current facility-administered medications on file prior to encounter.    Current Outpatient Medications on File Prior to Encounter  Medication Sig  . acetaminophen (TYLENOL) 500 MG tablet Take 1,000 mg by mouth every 8 (eight) hours as needed (pain).  . hydrochlorothiazide (HYDRODIURIL) 25 MG tablet Take 1 tablet (25 mg total) by mouth daily.  Marland Kitchen ibuprofen (ADVIL,MOTRIN) 200 MG tablet Take 600 mg by mouth every 6 (six) hours as needed (pain).  . indapamide (LOZOL) 1.25 MG tablet Take 1.25 mg by mouth daily.  . nebivolol (BYSTOLIC) 5 MG tablet Take 1 tablet (5 mg total) by mouth daily.  . rosuvastatin (CRESTOR) 10 MG tablet Take 10 mg by mouth daily.    FAMILY HISTORY:  His has no family status information on file.    SOCIAL  HISTORY: He  has an unknown smoking status. He has never used smokeless tobacco. He reports that he drinks alcohol. He reports that he does not use drugs.  REVIEW OF SYSTEMS:   Unable to obtain as pt is encephalopathic.  SUBJECTIVE:  On vent, unresponsive.  VITAL SIGNS: BP (!) 210/129   Pulse 99   Temp 97.6 F (36.4 C) (Oral)   Resp 17   Wt 104.3 kg (230 lb)   SpO2 97%   HEMODYNAMICS:    VENTILATOR SETTINGS: Vent Mode: PRVC FiO2 (%):  [50 %] 50 % Set Rate:  [18 bmp] 18 bmp Vt Set:  [510 mL] 510 mL PEEP:  [5 cmH20] 5 cmH20 Plateau Pressure:  [18 cmH20] 18 cmH20  INTAKE / OUTPUT: No intake/output data recorded.   PHYSICAL EXAMINATION: General: Adult male, in NAD. Neuro: Sedated, non-responsive. HEENT: Scalp dressings C/D/I, ventric drain in place.  Sclerae anicteric. Cardiovascular: RRR, no M/R/G.  Lungs: Respirations even and unlabored.  CTA bilaterally, No W/R/R. Abdomen: BS x 4, soft, NT/ND.  Musculoskeletal: No gross deformities, no edema.  Skin: Intact, warm, no rashes.  LABS:  BMET Recent Labs  Lab 02/27/18 1858 02/27/18 1908  NA 137 138  K 4.1 3.9  CL 101 103  CO2 19*  --   BUN 9 11  CREATININE 1.33* 1.20  GLUCOSE 131* 141*    Electrolytes Recent Labs  Lab 02/27/18 1858  CALCIUM 9.7    CBC Recent Labs  Lab 02/27/18 1858 02/27/18 1908  WBC 17.6*  --   HGB 16.4 18.4*  HCT 49.4 54.0*  PLT 232  --     Coag's Recent Labs  Lab 02/27/18 1858  INR 0.98    Sepsis Markers Recent Labs  Lab 02/27/18 1909  LATICACIDVEN 4.87*    ABG Recent Labs  Lab 02/28/18 0021  PHART 7.265*  PCO2ART 39.7  PO2ART 126.0*    Liver Enzymes Recent Labs  Lab 02/27/18 1858  AST 46*  ALT 26  ALKPHOS 88  BILITOT 0.9  ALBUMIN 4.6    Cardiac Enzymes No results for input(s): TROPONINI, PROBNP in the last 168 hours.  Glucose No results for input(s): GLUCAP in the last 168 hours.  Imaging Ct Head Wo Contrast  Result Date:  02/27/2018 CLINICAL DATA:  Erratically behavior.  Head trauma. EXAM: CT HEAD WITHOUT CONTRAST CT MAXILLOFACIAL WITHOUT CONTRAST CT CERVICAL SPINE WITHOUT CONTRAST TECHNIQUE: Multidetector CT imaging of the head, cervical spine, and maxillofacial structures were performed using the standard protocol without intravenous contrast. Multiplanar CT image reconstructions of the cervical spine and maxillofacial structures were also generated. COMPARISON:  None. FINDINGS: CT HEAD FINDINGS Brain: Acute intraparenchymal hemorrhage in the left cerebellum. Hematoma measures 4.5 x 3.7 x 2.6 cm. (volume = 23). Intraventricular penetration with filling of the fourth ventricle and some reflux into the third and lateral ventricles. Early ventricular fullness/hydrocephalus. This hemorrhages probably a hypertensive hemorrhage. The cerebral hemispheres and cells appear negative. Vascular: No abnormal vascular finding. Skull: Negative Other: Facial fracture as below. CT MAXILLOFACIAL FINDINGS Osseous: Orbital floor blowout fracture on the left containing fat. Some potential for trapping of the inferior rectus muscle. Fragments are displaced into the maxillary sinus along with a good bit of orbital fat. Fracture of the medial wall the maxillary sinus, bulging into the lateral left nasal passages. Orbits: No evidence of globe disruption. Pre and postseptal hemorrhage. No evidence of confluent postseptal hematoma. Sinuses: Clear other than the traumatic findings of the left maxillary sinus. Soft tissues: Otherwise negative. CT CERVICAL SPINE FINDINGS Alignment: Motion degraded study.  Alignment appears normal. Skull base and vertebrae: No evidence of fracture.  Motion degraded. Soft tissues and spinal canal: Negative Disc levels:  No significant degenerative changes. Upper chest: Not included. Other: None IMPRESSION: Acute intraparenchymal hemorrhage in the left cerebellar hemisphere with hematoma volume 23 cc. Intraventricular penetration  with developing hydrocephalus.These results were called by telephone at the time of interpretation on 02/27/2018 at 7:12 pm to Dr. Melene Plan , who verbally acknowledged these results. Large orbital floor blowout fracture on the left with displaced fragments into the maxillary sinus. Large amount of herniated orbital fat, with considerable potential for disruption of the biomechanics of the extraocular musculature. This may require surgical reduction. Fracture of the medial wall of the maxillary sinus bulging into the lateral left nasal passages. Motion degraded cervical CT not showing any abnormality. Electronically Signed   By: Paulina Fusi M.D.   On: 02/27/2018 19:20   Ct Chest W Contrast  Result Date: 02/27/2018 CLINICAL DATA:  Patient was having nausea, vomiting and diarrhea this morning and was found by wife in the bathroom having his head on bathtub. Hypertension. Blunt abdominal trauma. EXAM: CT CHEST, ABDOMEN, AND PELVIS WITH CONTRAST TECHNIQUE: Multidetector CT imaging of the chest, abdomen and pelvis was performed following the standard protocol during bolus administration of intravenous contrast. CONTRAST:  ISOVUE-300 IOPAMIDOL (ISOVUE-300) INJECTION 61% COMPARISON:  CXR 12/02/2005 FINDINGS: CT CHEST FINDINGS Cardiovascular: No mediastinal hematoma.  Normal size heart with coronary arteriosclerosis. Minimal aortic atherosclerosis without aneurysm or dissection. No large central pulmonary embolus. Mediastinum/Nodes: No mediastinal adenopathy. No dominant mass of the included thyroid nor thyromegaly. Patent midline trachea and mainstem bronchi. Unremarkable CT appearance of the esophagus. Lungs/Pleura: No pneumothorax or effusion. No pulmonary consolidation. The respiratory motion artifacts at the lung bases limit assessment. Musculoskeletal: No acute fracture. CT ABDOMEN PELVIS FINDINGS Hepatobiliary: No focal liver abnormality is seen. No gallstones, gallbladder wall thickening, or biliary  dilatation. Pancreas: Unremarkable. No pancreatic ductal dilatation or surrounding inflammatory changes. Spleen: No splenic injury or perisplenic hematoma. Adrenals/Urinary Tract: Adrenal glands are unremarkable. A 7.1 cm simple cyst is noted off the interpolar left kidney. The right kidney is unremarkable. No nephrolithiasis. No hydroureteronephrosis. Decompressed urinary bladder without focal mural thickening or calculus. Stomach/Bowel: Small hiatal hernia. Nondistended stomach with normal small bowel rotation. No bowel obstruction or inflammation. Normal appendix. Submucosal fat deposition compatible with chronic inflammatory bowel is identified of the colon and rectum with diffuse colonic spasm currently from cecum through distal descending colon. Vascular/Lymphatic: Mild aortoiliac atherosclerosis without adenopathy. Reproductive: Normal size prostate and seminal vesicles. Other: No free air nor free fluid. Musculoskeletal: No acute or significant osseous findings. Left acetabular bone island. IMPRESSION: 1. Coronary arteriosclerosis.  No active pulmonary disease. 2. No acute solid nor hollow visceral organ abnormality. 3. Submucosal fat deposition within the colon from cecum through descending colon as well as rectum compatible with changes of chronic inflammatory bowel. No active inflammatory process. Diffuse colonic spasm is seen currently. Electronically Signed   By: Tollie Eth M.D.   On: 02/27/2018 19:42   Ct Cervical Spine Wo Contrast  Result Date: 02/27/2018 CLINICAL DATA:  Erratically behavior.  Head trauma. EXAM: CT HEAD WITHOUT CONTRAST CT MAXILLOFACIAL WITHOUT CONTRAST CT CERVICAL SPINE WITHOUT CONTRAST TECHNIQUE: Multidetector CT imaging of the head, cervical spine, and maxillofacial structures were performed using the standard protocol without intravenous contrast. Multiplanar CT image reconstructions of the cervical spine and maxillofacial structures were also generated. COMPARISON:  None.  FINDINGS: CT HEAD FINDINGS Brain: Acute intraparenchymal hemorrhage in the left cerebellum. Hematoma measures 4.5 x 3.7 x 2.6 cm. (volume = 23). Intraventricular penetration with filling of the fourth ventricle and some reflux into the third and lateral ventricles. Early ventricular fullness/hydrocephalus. This hemorrhages probably a hypertensive hemorrhage. The cerebral hemispheres and cells appear negative. Vascular: No abnormal vascular finding. Skull: Negative Other: Facial fracture as below. CT MAXILLOFACIAL FINDINGS Osseous: Orbital floor blowout fracture on the left containing fat. Some potential for trapping of the inferior rectus muscle. Fragments are displaced into the maxillary sinus along with a good bit of orbital fat. Fracture of the medial wall the maxillary sinus, bulging into the lateral left nasal passages. Orbits: No evidence of globe disruption. Pre and postseptal hemorrhage. No evidence of confluent postseptal hematoma. Sinuses: Clear other than the traumatic findings of the left maxillary sinus. Soft tissues: Otherwise negative. CT CERVICAL SPINE FINDINGS Alignment: Motion degraded study.  Alignment appears normal. Skull base and vertebrae: No evidence of fracture.  Motion degraded. Soft tissues and spinal canal: Negative Disc levels:  No significant degenerative changes. Upper chest: Not included. Other: None IMPRESSION: Acute intraparenchymal hemorrhage in the left cerebellar hemisphere with hematoma volume 23 cc. Intraventricular penetration with developing hydrocephalus.These results were called by telephone at the time of interpretation on 02/27/2018 at 7:12 pm to Dr. Melene Plan , who verbally acknowledged these results. Large orbital floor blowout fracture on the left with displaced fragments into  the maxillary sinus. Large amount of herniated orbital fat, with considerable potential for disruption of the biomechanics of the extraocular musculature. This may require surgical reduction.  Fracture of the medial wall of the maxillary sinus bulging into the lateral left nasal passages. Motion degraded cervical CT not showing any abnormality. Electronically Signed   By: Paulina Fusi M.D.   On: 02/27/2018 19:20   Ct Abdomen Pelvis W Contrast  Result Date: 02/27/2018 CLINICAL DATA:  Patient was having nausea, vomiting and diarrhea this morning and was found by wife in the bathroom having his head on bathtub. Hypertension. Blunt abdominal trauma. EXAM: CT CHEST, ABDOMEN, AND PELVIS WITH CONTRAST TECHNIQUE: Multidetector CT imaging of the chest, abdomen and pelvis was performed following the standard protocol during bolus administration of intravenous contrast. CONTRAST:  ISOVUE-300 IOPAMIDOL (ISOVUE-300) INJECTION 61% COMPARISON:  CXR 12/02/2005 FINDINGS: CT CHEST FINDINGS Cardiovascular: No mediastinal hematoma. Normal size heart with coronary arteriosclerosis. Minimal aortic atherosclerosis without aneurysm or dissection. No large central pulmonary embolus. Mediastinum/Nodes: No mediastinal adenopathy. No dominant mass of the included thyroid nor thyromegaly. Patent midline trachea and mainstem bronchi. Unremarkable CT appearance of the esophagus. Lungs/Pleura: No pneumothorax or effusion. No pulmonary consolidation. The respiratory motion artifacts at the lung bases limit assessment. Musculoskeletal: No acute fracture. CT ABDOMEN PELVIS FINDINGS Hepatobiliary: No focal liver abnormality is seen. No gallstones, gallbladder wall thickening, or biliary dilatation. Pancreas: Unremarkable. No pancreatic ductal dilatation or surrounding inflammatory changes. Spleen: No splenic injury or perisplenic hematoma. Adrenals/Urinary Tract: Adrenal glands are unremarkable. A 7.1 cm simple cyst is noted off the interpolar left kidney. The right kidney is unremarkable. No nephrolithiasis. No hydroureteronephrosis. Decompressed urinary bladder without focal mural thickening or calculus. Stomach/Bowel: Small  hiatal hernia. Nondistended stomach with normal small bowel rotation. No bowel obstruction or inflammation. Normal appendix. Submucosal fat deposition compatible with chronic inflammatory bowel is identified of the colon and rectum with diffuse colonic spasm currently from cecum through distal descending colon. Vascular/Lymphatic: Mild aortoiliac atherosclerosis without adenopathy. Reproductive: Normal size prostate and seminal vesicles. Other: No free air nor free fluid. Musculoskeletal: No acute or significant osseous findings. Left acetabular bone island. IMPRESSION: 1. Coronary arteriosclerosis.  No active pulmonary disease. 2. No acute solid nor hollow visceral organ abnormality. 3. Submucosal fat deposition within the colon from cecum through descending colon as well as rectum compatible with changes of chronic inflammatory bowel. No active inflammatory process. Diffuse colonic spasm is seen currently. Electronically Signed   By: Tollie Eth M.D.   On: 02/27/2018 19:42   Dg Chest Port 1 View  Result Date: 02/28/2018 CLINICAL DATA:  Intubation and orogastric tube placement. EXAM: PORTABLE CHEST 1 VIEW COMPARISON:  Chest CT earlier this day. FINDINGS: Endotracheal tube 2.5 cm from the carina. Enteric tube in place with tip below the diaphragm. Side-port is not confidently visualized. Left subclavian central venous catheter tip in the midline in the region of the brachiocephalic vein. Borderline cardiomegaly with normal mediastinal contours. Mild right basilar atelectasis. No pulmonary edema or pleural effusion. No pneumothorax. IMPRESSION: 1. Endotracheal tube 2.5 cm from the carina. Enteric tube in place with tip below the diaphragm. 2. Left subclavian central line tip in the region of the distal brachiocephalic vein. No pneumothorax. 3. Right basilar atelectasis. Electronically Signed   By: Rubye Oaks M.D.   On: 02/28/2018 00:33   Ct Maxillofacial Wo Contrast  Result Date: 02/27/2018 CLINICAL  DATA:  Erratically behavior.  Head trauma. EXAM: CT HEAD WITHOUT CONTRAST CT MAXILLOFACIAL WITHOUT CONTRAST  CT CERVICAL SPINE WITHOUT CONTRAST TECHNIQUE: Multidetector CT imaging of the head, cervical spine, and maxillofacial structures were performed using the standard protocol without intravenous contrast. Multiplanar CT image reconstructions of the cervical spine and maxillofacial structures were also generated. COMPARISON:  None. FINDINGS: CT HEAD FINDINGS Brain: Acute intraparenchymal hemorrhage in the left cerebellum. Hematoma measures 4.5 x 3.7 x 2.6 cm. (volume = 23). Intraventricular penetration with filling of the fourth ventricle and some reflux into the third and lateral ventricles. Early ventricular fullness/hydrocephalus. This hemorrhages probably a hypertensive hemorrhage. The cerebral hemispheres and cells appear negative. Vascular: No abnormal vascular finding. Skull: Negative Other: Facial fracture as below. CT MAXILLOFACIAL FINDINGS Osseous: Orbital floor blowout fracture on the left containing fat. Some potential for trapping of the inferior rectus muscle. Fragments are displaced into the maxillary sinus along with a good bit of orbital fat. Fracture of the medial wall the maxillary sinus, bulging into the lateral left nasal passages. Orbits: No evidence of globe disruption. Pre and postseptal hemorrhage. No evidence of confluent postseptal hematoma. Sinuses: Clear other than the traumatic findings of the left maxillary sinus. Soft tissues: Otherwise negative. CT CERVICAL SPINE FINDINGS Alignment: Motion degraded study.  Alignment appears normal. Skull base and vertebrae: No evidence of fracture.  Motion degraded. Soft tissues and spinal canal: Negative Disc levels:  No significant degenerative changes. Upper chest: Not included. Other: None IMPRESSION: Acute intraparenchymal hemorrhage in the left cerebellar hemisphere with hematoma volume 23 cc. Intraventricular penetration with developing  hydrocephalus.These results were called by telephone at the time of interpretation on 02/27/2018 at 7:12 pm to Dr. Melene Plan , who verbally acknowledged these results. Large orbital floor blowout fracture on the left with displaced fragments into the maxillary sinus. Large amount of herniated orbital fat, with considerable potential for disruption of the biomechanics of the extraocular musculature. This may require surgical reduction. Fracture of the medial wall of the maxillary sinus bulging into the lateral left nasal passages. Motion degraded cervical CT not showing any abnormality. Electronically Signed   By: Paulina Fusi M.D.   On: 02/27/2018 19:20     STUDIES:  CT head 4/10 > acute IPH in left cerebellar hemisphere, intraventricular penetration with developing hydrocephalus.  Large orbital floor blowout fx on left with displaced fragments into maxillary sinus, large amount of herniated orbital fat, fx of medial wall of maxillary sinus. CT chest / abd / pelv 4/10 > no acute process.  CULTURES: None.  ANTIBIOTICS: None.  SIGNIFICANT EVENTS: 4/10 > admit, to OR for right frontal ventriculostomy drain placement, suboccipital craniectomy for evacuation of left cerebellar hematoma. Also seen by ENT for left eyelid laceration closure.  LINES/TUBES: ETT 4/10 >  Art line 4/10 >  DISCUSSION: 52 y.o. male admitted 4/10 with left ICH s/p craniectomy and ventric drain placement.  Returned to ICU on vent and PCCM asked to assist with vent management.  ASSESSMENT / PLAN:  PULMONARY A: Respiratory insufficiency - remains intubated post op. P:   Full vent support. Wean as able. VAP prevention measures. SBT in AM if able. CXR in AM.  CARDIOVASCULAR A:  Hypertensive emergency. P:  Continue cardene, goal SBP 160's per neuro. Continue labetalol PRN.  RENAL A:   Hypocalcemia. P:   1g Ca gluconate. BMP in AM.  GASTROINTESTINAL A:   GI prophylaxis. Nutrition. P:   SUP:  Pantoprazole. NPO.  HEMATOLOGIC A:   VTE Prophylaxis. P:  SCD's. CBC in AM.  INFECTIOUS A:   No indication of infection. P:  Monitor clinically.  ENDOCRINE A:   No acute issues.   P:   No interventions required.  NEUROLOGIC A:   Left ICH - s/p craniectomy and ventric drain placement. Sedation needs due to mechanical ventilation. P:   Post op care per neurosurgery. Continue decadron per neurosurgery. Sedation:  Propofol gtt / Fentanyl PRN. RASS goal: 0 to -1. Daily WUA.  HEENT: A: Left eyelid laceration - s/p repair in OR. Left orbital blow out fx, medial maxially fx. P: ENT following. Will need ophthalmology consult once recovers from this event. Might require ORIF of orbital blow out fx.  Family updated: Wife updated at bedside.  Interdisciplinary Family Meeting v Palliative Care Meeting:  Due by: 03/05/18.  CC time: 30 min.   Rutherford Guysahul Desai, [place] - [facility] Pager: [phone]  or [phone] 02/28/2018, 12:44 AM

## 2018-02-28 NOTE — Anesthesia Postprocedure Evaluation (Signed)
Anesthesia Post Note  Patient: Austin Leonard  Procedure(s) Performed: CRANIECTOMY FOR SUBOCCIPTAL HEMORRHAGE (N/A Head) EYELID  LACERATION CLOSURE (Left Face)     Patient location during evaluation: NICU Anesthesia Type: General Level of consciousness: sedated and patient remains intubated per anesthesia plan Pain management: pain level controlled Vital Signs Assessment: post-procedure vital signs reviewed and stable Respiratory status: patient remains intubated per anesthesia plan and patient on ventilator - see flowsheet for VS Cardiovascular status: stable and blood pressure returned to baseline Postop Assessment: no apparent nausea or vomiting Anesthetic complications: no    Last Vitals:  Vitals:   02/28/18 0415 02/28/18 0430  BP: 122/77 130/76  Pulse: (!) 116 (!) 116  Resp: (!) 22 (!) 23  Temp: 37.4 C 37.4 C  SpO2: 98% 98%    Last Pain:  Vitals:   02/27/18 1831  TempSrc: Oral                 Maddeline Roorda COKER

## 2018-03-01 ENCOUNTER — Inpatient Hospital Stay (HOSPITAL_COMMUNITY): Payer: BLUE CROSS/BLUE SHIELD

## 2018-03-01 LAB — POCT I-STAT 7, (LYTES, BLD GAS, ICA,H+H)
Acid-base deficit: 5 mmol/L — ABNORMAL HIGH (ref 0.0–2.0)
Bicarbonate: 24 mmol/L (ref 20.0–28.0)
CALCIUM ION: 1.15 mmol/L (ref 1.15–1.40)
HEMATOCRIT: 50 % (ref 39.0–52.0)
HEMOGLOBIN: 17 g/dL (ref 13.0–17.0)
O2 Saturation: 100 %
POTASSIUM: 4.8 mmol/L (ref 3.5–5.1)
SODIUM: 138 mmol/L (ref 135–145)
TCO2: 26 mmol/L (ref 22–32)
pCO2 arterial: 56.9 mmHg — ABNORMAL HIGH (ref 32.0–48.0)
pH, Arterial: 7.227 — ABNORMAL LOW (ref 7.350–7.450)
pO2, Arterial: 331 mmHg — ABNORMAL HIGH (ref 83.0–108.0)

## 2018-03-01 LAB — BASIC METABOLIC PANEL
ANION GAP: 10 (ref 5–15)
BUN: 13 mg/dL (ref 6–20)
CHLORIDE: 107 mmol/L (ref 101–111)
CO2: 20 mmol/L — AB (ref 22–32)
Calcium: 8.8 mg/dL — ABNORMAL LOW (ref 8.9–10.3)
Creatinine, Ser: 1.02 mg/dL (ref 0.61–1.24)
GFR calc non Af Amer: >60 mL/min (ref >60)
Glucose, Bld: 126 mg/dL — ABNORMAL HIGH (ref 65–99)
Potassium: 4 mmol/L (ref 3.5–5.1)
Sodium: 137 mmol/L (ref 135–145)

## 2018-03-01 LAB — PHOSPHORUS: Phosphorus: 2.7 mg/dL (ref 2.5–4.6)

## 2018-03-01 LAB — MAGNESIUM: Magnesium: 2.1 mg/dL (ref 1.7–2.4)

## 2018-03-01 MED ORDER — SODIUM CHLORIDE 0.9% FLUSH
10.0000 mL | Freq: Two times a day (BID) | INTRAVENOUS | Status: DC
  Administered 2018-03-01 – 2018-03-02 (×5): 10 mL
  Administered 2018-03-03: 20 mL
  Administered 2018-03-04 – 2018-03-06 (×4): 10 mL

## 2018-03-01 MED ORDER — SODIUM CHLORIDE 0.9% FLUSH: 10.0000 mL | INTRAVENOUS | Status: DC | PRN

## 2018-03-01 MED ORDER — ROSUVASTATIN CALCIUM 10 MG PO TABS
10.0000 mg | ORAL_TABLET | Freq: Every day | ORAL | Status: DC
  Administered 2018-03-01 – 2018-03-15 (×15): 10 mg via ORAL
  Filled 2018-03-01 (×16): qty 1

## 2018-03-01 NOTE — Progress Notes (Signed)
PULMONARY / CRITICAL CARE MEDICINE   Name: Austin Leonard MRN: [id number] DOB: Sep 12, 1966    ADMISSION DATE:  02/27/2018 CONSULTATION DATE:  02/27/18  REFERRING MD:  Yetta BarreJones  CHIEF COMPLAINT:  Post op management  HISTORY OF PRESENT ILLNESS:  Pt is encephelopathic; therefore, this HPI is obtained from chart review. Austin Leonard is a 52 y.o. male with PMH as outlined below. He had a fall 4/10 in bathroom at home and might have struck his face on the bathtub after having dizziness and headache earlier that day. Evaluation in ED included CT head that demonstrated a left ICH, left medial orbital blow out fx including fx of medial wall of maxillary sinus, herniation of orbital fat, left lateral upper eyelid laceration.  He was taken to the OR and had right frontal ventriculostomy drain placement, suboccipital craniectomy for evacuation of left cerebellar hematoma.  He also had left eyelid repaired by ENT.  He then returned to the ICU on the vent and PCCM was asked to assist with vent management.  02/28/18 The [atient is on the ventiator. He is awake  and responsive. He has been on SBT PS 5 and has excellent TVs. BP is running a little high at present. 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 Has an EVD for hydrocephalus and Increased ICP.  4/12 The patient is awake and talking. His speech is appropriate and fluent He can move all extremities Has EVD in place with the manometer set at 10cm. Last imaging was yesterday. The patient is eating and drinking BP under reasonable control.We are trying to get patient off the nicardipine drip which is at 10 presently  PAST MEDICAL HISTORY :  He  has a past medical history of Hypertension.  PAST SURGICAL HISTORY: He  has a past surgical history that includes Craniotomy (N/A, 02/27/2018) and Facial laceration repair (Left, 02/27/2018).  No Known Allergies  No current facility-administered  medications on file prior to encounter.    Current Outpatient Medications on File Prior to Encounter  Medication Sig  . acetaminophen (TYLENOL) 500 MG tablet Take 1,000 mg by mouth every 8 (eight) hours as needed (pain).  Marland Kitchen. aspirin 325 MG tablet Take 325 mg by mouth daily.  . hydrochlorothiazide (HYDRODIURIL) 25 MG tablet Take 1 tablet (25 mg total) by mouth daily. (Patient not taking: Reported on 02/28/2018)  . ibuprofen (ADVIL,MOTRIN) 200 MG tablet Take 600 mg by mouth every 6 (six) hours as needed (pain).  . indapamide (LOZOL) 1.25 MG tablet Take 1.25 mg by mouth daily.  . nebivolol (BYSTOLIC) 5 MG tablet Take 1 tablet (5 mg total) by mouth daily. (Patient not taking: Reported on 02/28/2018)  . rosuvastatin (CRESTOR) 10 MG tablet Take 10 mg by mouth daily.    FAMILY HISTORY:  His has no family status information on file.    SOCIAL HISTORY: He  has an unknown smoking status. He has never used smokeless tobacco. He reports that he drinks alcohol. He reports that he does not use drugs.     VITAL SIGNS: BP 139/86   Pulse 79   Temp 98.6 F (37 C)   Resp (!) 28   Ht 5\' 6"  (1.676 m)   Wt 229 lb 4.5 oz (104 kg)   SpO2 (!) 86%   BMI 37.01 kg/m   H INTAKE / OUTPUT: I/O last 3 completed shifts: In: 5995.8 [I.V.:5945.8; IV Piggyback:50] Out: 2878 [Urine:2440; Drains:338; Blood:100]   PHYSICAL EXAMINATION: General:  Adult male, in NAD. Neuro: Sedated, non-responsive. HEENT: Scalp dressings C/D/I, ventric drain in place.  Sclerae anicteric. Cardiovascular: RRR, no M/R/G.  Lungs: Respirations even and unlabored.  CTA bilaterally, No W/R/R. Abdomen: BS x 4, soft, NT/ND.  Musculoskeletal: No gross deformities, no edema.  Skin: Intact, warm, no rashes.  LABS:  BMET Recent Labs  Lab 02/27/18 1858 02/27/18 1908 02/28/18 0527 03/01/18 0453  NA 137 138 138 137  K 4.1 3.9 4.1 4.0  CL 101 103 107 107  CO2 19*  --  19* 20*  BUN 9 11 11 13   CREATININE 1.33* 1.20 1.51* 1.02   GLUCOSE 131* 141* 115* 126*    Electrolytes Recent Labs  Lab 02/27/18 1858 02/28/18 0527 03/01/18 0453  CALCIUM 9.7 8.7* 8.8*  MG  --   --  2.1  PHOS  --   --  2.7    CBC Recent Labs  Lab 02/27/18 1858 02/27/18 1908 02/28/18 0527  WBC 17.6*  --  16.7*  HGB 16.4 18.4* 14.2  HCT 49.4 54.0* 43.2  PLT 232  --  210    Coag's Recent Labs  Lab 02/27/18 1858  INR 0.98    Sepsis Markers Recent Labs  Lab 02/27/18 1909 02/27/18 2327  LATICACIDVEN 4.87* 3.7*    ABG Recent Labs  Lab 02/28/18 0021  PHART 7.265*  PCO2ART 39.7  PO2ART 126.0*    Liver Enzymes Recent Labs  Lab 02/27/18 1858  AST 46*  ALT 26  ALKPHOS 88  BILITOT 0.9  ALBUMIN 4.6    Cardiac Enzymes No results for input(s): TROPONINI, PROBNP in the last 168 hours.  Glucose No results for input(s): GLUCAP in the last 168 hours.  Imaging Portable Chest Xray  Result Date: 03/01/2018 CLINICAL DATA:  52 year old male status post trauma with intracranial hemorrhage, Neuro surgical evacuation. EXAM: PORTABLE CHEST 1 VIEW COMPARISON:  Fourteen 19 and earlier. FINDINGS: Portable AP semi upright view at 0504 hours. Extubated and enteric tube removed. Stable left subclavian approach central line. Mildly larger lung volumes. Normal cardiac size and mediastinal contours. Mild bilateral basilar predominant pulmonary interstitial opacity, but otherwise when allowing for portable technique the lungs are clear. No pneumothorax or pleural effusion. IMPRESSION: 1. Extubated and enteric tube removed. 2. Mild nonspecific basilar predominant increased interstitial opacity in both lungs, perhaps vascular congestion without edema. Viral or atypical respiratory infection felt less likely. 3. No other acute cardiopulmonary abnormality. Electronically Signed   By: Odessa Fleming M.D.   On: 03/01/2018 07:59     STUDIES:  CT head 4/10 > acute IPH in left cerebellar hemisphere, intraventricular penetration with developing  hydrocephalus.  Large orbital floor blowout fx on left with displaced fragments into maxillary sinus, large amount of herniated orbital fat, fx of medial wall of maxillary sinus. CT chest / abd / pelv 4/10 > no acute process.    SIGNIFICANT EVENTS: 4/10 > admit, to OR for right frontal ventriculostomy drain placement, suboccipital craniectomy for evacuation of left cerebellar hematoma. Also seen by ENT for left eyelid laceration closure.  LINES/TUBES:  Art line 4/10 > CVP  DISCUSSION: 52 y.o. male admitted 4/10 with left ICH s/p craniectomy and ventric drain placement.  Returned to ICU on vent and PCCM asked to assist with vent management.  ASSESSMENT / PLAN:  PULMONARY The patient was extubated yesterday and is doing well.    Hypertensive emergency.  His bpm is under reasonable control he does remain on the carden drip. We have restarted home bystolic and  hydrodiuril. We are hoping to wean off the drip shortly     Renal  The patient has GFR about 60,. Unsure if this is old or new. Urine output acceptable. His renal fn. Has returned to nrmal with creat of 1.02 Had about 1500 cc urine in the past 24 hours.  GASTROINTESTINAL A:   GI prophylaxis. The patient as noted is eating and rinking P:   SUP: Pantoprazole. NPO.  HEMATOLOGIC A:   VTE Prophylaxis. P:  SCD's. CBC in AM.  INEUROLOGIC A:   Left ICH - s/p craniectomy and ventric drain placement. Sedation needs due to mechanical ventilation. Patient responsive Await neurosurgery f/u   P   HEENT: A: Left eyelid laceration - s/p repair in OR. Left orbital blow out fx, medial maxially fx. P: ENT following. Will need ophthalmology consult once recovers from this event. Might require ORIF of orbital blow out fx.  Overall the patient is doing very well given his major intracerbral hemorrhage.

## 2018-03-01 NOTE — Progress Notes (Signed)
Neurosurgery Progress Note  No issues overnight.  Extubated yesterday evening Up eating breakfast this am Complains of mild posterior HA, otherwise no concerns  EXAM:  BP (!) 143/123   Pulse (!) 122   Temp 98.6 F (37 C)   Resp (!) 27   Ht 5\' 6"  (1.676 m)   Wt 104 kg (229 lb 4.5 oz)   SpO2 91%   BMI 37.01 kg/m   Awake, alert oriented to self, DOB, year. Not oriented to location Speech appropriate Periorbital swelling left eye MAEW and symmetrically Incision c/d/i EVD with blood tinged CSF, pulsatile flow  PLAN Extubated yesterday and appears to be doing well since extubation. Neurologically stable.  Continue current care.

## 2018-03-01 NOTE — Progress Notes (Signed)
Arterial line very positional with very labile blood pressure readings and waveform with large whip. Will go by cuff pressure for medication titration at this time.

## 2018-03-02 DIAGNOSIS — I614 Nontraumatic intracerebral hemorrhage in cerebellum: Secondary | ICD-10-CM

## 2018-03-02 MED ORDER — NEBIVOLOL HCL 10 MG PO TABS
10.0000 mg | ORAL_TABLET | Freq: Every day | ORAL | Status: DC
  Administered 2018-03-03 – 2018-03-15 (×13): 10 mg via ORAL
  Filled 2018-03-02 (×14): qty 1

## 2018-03-02 NOTE — Progress Notes (Signed)
PULMONARY / CRITICAL CARE MEDICINE   Name: Austin Leonard MRN: [id number] DOB: 1965/12/06    ADMISSION DATE:  02/27/2018 CONSULTATION DATE:  02/27/18  REFERRING MD:  Yetta Barre  CHIEF COMPLAINT:  Post op management  HISTORY OF PRESENT ILLNESS:  Pt is encephelopathic; therefore, this HPI is obtained from chart review. Austin Leonard is a 52 y.o. male with PMH as outlined below. He had a fall 4/10 in bathroom at home and might have struck his face on the bathtub after having dizziness and headache earlier that day. Evaluation in ED included CT head that demonstrated a left ICH, left medial orbital blow out fx including fx of medial wall of maxillary sinus, herniation of orbital fat, left lateral upper eyelid laceration.  He was taken to the OR and had right frontal ventriculostomy drain placement, suboccipital craniectomy for evacuation of left cerebellar hematoma.  He also had left eyelid repaired by ENT.  He then returned to the ICU on the vent and PCCM was asked to assist with vent management.  02/28/18 The [atient is on the ventiator. He is awake  and responsive. He has been on SBT PS 5 and has excellent TVs. BP is running a little high at present. 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 Has an EVD for hydrocephalus and Increased ICP.  4/12 The patient is awake and talking. His speech is appropriate and fluent He can move all extremities Has EVD in place with the manometer set at 10cm. Last imaging was yesterday. The patient is eating and drinking BP under reasonable control.We are trying to get patient off the nicardipine drip which is at 10 presently.  4/13 The patient is fully alert and oriented. No major complaints. BP is reaonably controlled. I did increase his bystolic to 10mg  as his systolic runs a tad high at times He is eating and drinking.EVD still in place, Will defer otheri maging to neuro and neurosurgery.Marland Kitchen   PAST  MEDICAL HISTORY :  He  has a past medical history of Hypertension.  PAST SURGICAL HISTORY: He  has a past surgical history that includes Craniotomy (N/A, 02/27/2018) and Facial laceration repair (Left, 02/27/2018).  No Known Allergies  No current facility-administered medications on file prior to encounter.    Current Outpatient Medications on File Prior to Encounter  Medication Sig  . acetaminophen (TYLENOL) 500 MG tablet Take 1,000 mg by mouth every 8 (eight) hours as needed (pain).  Marland Kitchen aspirin 325 MG tablet Take 325 mg by mouth daily.  . hydrochlorothiazide (HYDRODIURIL) 25 MG tablet Take 1 tablet (25 mg total) by mouth daily. (Patient not taking: Reported on 02/28/2018)  . ibuprofen (ADVIL,MOTRIN) 200 MG tablet Take 600 mg by mouth every 6 (six) hours as needed (pain).  . indapamide (LOZOL) 1.25 MG tablet Take 1.25 mg by mouth daily.  . nebivolol (BYSTOLIC) 5 MG tablet Take 1 tablet (5 mg total) by mouth daily. (Patient not taking: Reported on 02/28/2018)  . rosuvastatin (CRESTOR) 10 MG tablet Take 10 mg by mouth daily.    FAMILY HISTORY:  His has no family status information on file.    SOCIAL HISTORY: He  has an unknown smoking status. He has never used smokeless tobacco. He reports that he drinks alcohol. He reports that he does not use drugs.     VITAL SIGNS: BP (!) 145/95   Pulse 100   Temp 98.8 F (37.1 C) (Oral)   Resp 16  Ht 5\' 6"  (1.676 m)   Wt 229 lb 4.5 oz (104 kg)   SpO2 100%   BMI 37.01 kg/m   H INTAKE / OUTPUT: I/O last 3 completed shifts: In: 3461.2 [I.V.:3461.2] Out: 4738 [Urine:4375; Drains:363]   PHYSICAL EXAMINATION: General: Adult male, in NAD. Neuro: Sedated, non-responsive. HEENT: Scalp dressings C/D/I, ventric drain in place.  Sclerae anicteric. Cardiovascular: RRR, no M/R/G.  Lungs: Respirations even and unlabored.  CTA bilaterally, No W/R/R. Abdomen: BS x 4, soft, NT/ND.  Musculoskeletal: No gross deformities, no edema.  Skin: Intact,  warm, no rashes.  LABS:  BMET Recent Labs  Lab 02/27/18 1858 02/27/18 1908 02/27/18 2139 02/28/18 0527 03/01/18 0453  NA 137 138 138 138 137  K 4.1 3.9 4.8 4.1 4.0  CL 101 103  --  107 107  CO2 19*  --   --  19* 20*  BUN 9 11  --  11 13  CREATININE 1.33* 1.20  --  1.51* 1.02  GLUCOSE 131* 141*  --  115* 126*    Electrolytes Recent Labs  Lab 02/27/18 1858 02/28/18 0527 03/01/18 0453  CALCIUM 9.7 8.7* 8.8*  MG  --   --  2.1  PHOS  --   --  2.7    CBC Recent Labs  Lab 02/27/18 1858 02/27/18 1908 02/27/18 2139 02/28/18 0527  WBC 17.6*  --   --  16.7*  HGB 16.4 18.4* 17.0 14.2  HCT 49.4 54.0* 50.0 43.2  PLT 232  --   --  210    Coag's Recent Labs  Lab 02/27/18 1858  INR 0.98    Sepsis Markers Recent Labs  Lab 02/27/18 1909 02/27/18 2327  LATICACIDVEN 4.87* 3.7*    ABG Recent Labs  Lab 02/27/18 2139 02/28/18 0021  PHART 7.227* 7.265*  PCO2ART 56.9* 39.7  PO2ART 331.0* 126.0*    Liver Enzymes Recent Labs  Lab 02/27/18 1858  AST 46*  ALT 26  ALKPHOS 88  BILITOT 0.9  ALBUMIN 4.6    Cardiac Enzymes No results for input(s): TROPONINI, PROBNP in the last 168 hours.  Glucose No results for input(s): GLUCAP in the last 168 hours.  Imaging No results found.   STUDIES:  CT head 4/10 > acute IPH in left cerebellar hemisphere, intraventricular penetration with developing hydrocephalus.  Large orbital floor blowout fx on left with displaced fragments into maxillary sinus, large amount of herniated orbital fat, fx of medial wall of maxillary sinus. CT chest / abd / pelv 4/10 > no acute process.    SIGNIFICANT EVENTS: 4/10 > admit, to OR for right frontal ventriculostomy drain placement, suboccipital craniectomy for evacuation of left cerebellar hematoma. Also seen by ENT for left eyelid laceration closure.  LINES/TUBES:  Art line 4/10 > CVP  DISCUSSION: 52 y.o. male admitted 4/10 with left ICH s/p craniectomy and ventric drain  placement.  Returned to ICU on vent and PCCM asked to assist with vent management.  ASSESSMENT / PLAN:  PULMONARY Respiratory status is good. He is maintaining his 02 sats ansd reps. Effort fine    Hypertensive emergency.  His bpm is under reasonable control he does remain on the carden drip. We have restarted home     I increased bystolic dose. Patient is off parenteral anti-HTN  Renal  The patient has GFR about 60,. Unsure if this is old or new. Urine output acceptable. His renal fn. Has returned to nrmal with creat of 1.02 Had about 1500 cc urine in the past 24  hours.  GASTROINTESTINAL A:   GI prophylaxis. The patient as noted is eating and drinking P:   SUP: Pantoprazole. NPO.  HEMATOLOGIC A:   VTE Prophylaxis. P:  SCD's. CBC in AM.  INEUROLOGIC The patient is doing well neurolgically. No major focal deficit. Speech is appororiate and fluent. Patient oriented x 3.    HEENT A: Left eyelid laceration - s/p repair in OR. Left orbital blow out fx, medial maxially fx. P: ENT following. Will need ophthalmology consult once recovers from this event. Might require ORIF of orbital blow out fx.  Overall the patient is doing very well given his major intracerbral hemorrhage. We will sign off at this point

## 2018-03-02 NOTE — Progress Notes (Signed)
Pt seen and examined. No issues overnight. No complaints this am.  EXAM: Temp:  [98.3 F (36.8 C)-99.1 F (37.3 C)] 98.8 F (37.1 C) (04/13 0814) Pulse Rate:  [67-125] 97 (04/13 0900) Resp:  [11-28] 19 (04/13 0900) BP: (74-176)/(50-119) 142/91 (04/13 0900) SpO2:  [83 %-100 %] 97 % (04/13 0900) Intake/Output      04/12 0701 - 04/13 0700 04/13 0701 - 04/14 0700   I.V. (mL/kg) 1578.6 (15.2) 100 (1)   Total Intake(mL/kg) 1578.6 (15.2) 100 (1)   Urine (mL/kg/hr) 3525 (1.4)    Drains 238 30   Stool 0    Total Output 3763 30   Net -2184.4 +70        Urine Occurrence 1 x    Stool Occurrence 4 x     Awake, alert Speech fluent Follows commands, MAE well EVD in place, bloody CSF Wound c/d/i, no CSF leak  LABS: Lab Results  Component Value Date   CREATININE 1.02 03/01/2018   BUN 13 03/01/2018   NA 137 03/01/2018   K 4.0 03/01/2018   CL 107 03/01/2018   CO2 20 (L) 03/01/2018   Lab Results  Component Value Date   WBC 16.7 (H) 02/28/2018   HGB 14.2 02/28/2018   HCT 43.2 02/28/2018   MCV 85.2 02/28/2018   PLT 210 02/28/2018    IMPRESSION: - 52 y.o. male POD#3 s/p pos fossa craniectomy and EVD for cerebellar hemorrhage/IVH doing well  PLAN: - Mobilize today - Keep EVD open at 0

## 2018-03-03 MED ORDER — HYDRALAZINE HCL 20 MG/ML IJ SOLN
15.0000 mg | Freq: Three times a day (TID) | INTRAMUSCULAR | Status: DC | PRN
  Administered 2018-03-03 – 2018-03-12 (×5): 15 mg via INTRAVENOUS
  Filled 2018-03-03 (×6): qty 1

## 2018-03-03 NOTE — Progress Notes (Signed)
OT Cancellation Note  Patient Details Name: Austin Leonard MRN: [id number] DOB: [birth year]   Cancelled Treatment:    Reason Eval/Treat Not Completed: Active bedrest order. Will await increase in activity orders prior to initiation of evaluation. Note mobilization recommended in MD note 03/02/18.   Doristine Sectionharity A Nevayah Faust, MS OTR/L  Pager: [phone]   Doristine SectionCharity A Lonnel Gjerde 03/03/2018, 6:36 AM

## 2018-03-03 NOTE — Evaluation (Signed)
Speech Language Pathology Evaluation Patient Details Name: Austin Leonard MRN: [id number] DOB: 06/03/1966 Today's Date: 03/03/2018 Time: 7829-5621 SLP Time Calculation (min) (ACUTE ONLY): 15 min  Problem List:  Patient Active Problem List   Diagnosis Date Noted  . S/P craniotomy 02/27/2018   Past Medical History:  Past Medical History:  Diagnosis Date  . Hypertension    Past Surgical History:  Past Surgical History:  Procedure Laterality Date  . CRANIOTOMY N/A 02/27/2018   Procedure: CRANIECTOMY FOR SUBOCCIPTAL HEMORRHAGE;  Surgeon: Tia Alert, MD;  Location: [facility] OR;  Service: Neurosurgery;  Laterality: N/A;  . FACIAL LACERATION REPAIR Left 02/27/2018   Procedure: EYELID  LACERATION CLOSURE;  Surgeon: Flo Shanks, MD;  Location[facility] OR;  Service: ENT;  Laterality: Left;   HPI:  52 year old male who was noted to have dizziness and headache and not feeling well earlier today. Wife came back home to find the house in disarray and him less responsive. He was brought to emergency department with these findings. 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 The patient has a left orbital fracture also. ENT has been consulted. Patient is lethargic but arousable and cannot cooperate with history and physical very well.   Patient is s/p frontal ventriculostomy placement and sub-occipital craniectomy.   Chest xray is showing non specific basilar interstitial opacity with lungs ? vascular congestion without edema.  Most recent head CT is showing evacuatin of left cerebellar hematoma.     Assessment / Plan / Recommendation Clinical Impression  Cognitive/linguistic and motor speech screen were completed.  The patient's speech was clear and easy to understand. No discernible dysarthria noted.  The patient achieved a score of 25/28 on the Mini Mental State Exam (i.e. the writing and copying portions were not administered).  In general the patient's  response to things was slow.  The patient was able to perform the attention task accurately but did require redirection to task periodically during evaluation.  He was able to name objects, repeat a short phrase and follow a 3 step command.  Deficits noted for orientation.  The patient was oriented to person and situation. He was partially oriented to time (i.e. knew month, year, day of week but not date) and location (i.e. knew he was in hospital but not which hospital).   Deficits also noted for STM.  Immediate recall of 3 novel words was good.  Given short delay the patient was also able to recall 3 novel words with 100% accuracy.  Given longer delay of about 5 minutes the patient was only able to recall 1/3.  Use of semantic cues facilitated recall of all 3 novel words.  The patient and wife reported an excellent memory prior to this event.  The patient was able to provide logical solutions to simple problems.  Given changes from his baseline and slow processing time in setting of hemorrhagic CVA ST to follow during acute stay.  Patient may benefit from ST follow up at the next level of care.      SLP Assessment  SLP Recommendation/Assessment: Patient needs continued Speech Lanaguage Pathology Services SLP Visit Diagnosis: Cognitive communication deficit (R41.841)    Follow Up Recommendations  Other (comment)(TBD)    Frequency and Duration min 2x/week  2 weeks      SLP Evaluation Cognition  Overall Cognitive Status: Impaired/Different from baseline Arousal/Alertness: Awake/alert Orientation Level: Oriented to person;Oriented to situation Attention: Focused Focused Attention: Appears intact Memory: Impaired Memory Impairment: Decreased  recall of new information Awareness: Appears intact Problem Solving: Appears intact       Comprehension  Auditory Comprehension Overall Auditory Comprehension: Appears within functional limits for tasks assessed Commands: Within Functional  Limits Conversation: Simple EffectiveTechniques: Extra processing time Reading Comprehension Reading Status: Within funtional limits    Expression Expression Primary Mode of Expression: Verbal Verbal Expression Overall Verbal Expression: Appears within functional limits for tasks assessed Initiation: No impairment Automatic Speech: Name;Social Response Level of Generative/Spontaneous Verbalization: Sentence Repetition: No impairment Naming: No impairment Pragmatics: No impairment Non-Verbal Means of Communication: Not applicable   Oral / Motor  Oral Motor/Sensory Function Overall Oral Motor/Sensory Function: Within functional limits Motor Speech Overall Motor Speech: Appears within functional limits for tasks assessed Respiration: Within functional limits Phonation: Normal Resonance: Within functional limits Articulation: Within functional limitis Intelligibility: Intelligible Motor Planning: Witnin functional limits Motor Speech Errors: Not applicable   GO                   Dimas AguasMelissa Markhi Kleckner, MA, CCC-SLP Acute Rehab SLP [phone] Fleet ContrasMelissa N Tayvin Preslar 03/03/2018, 8:48 AM

## 2018-03-03 NOTE — Plan of Care (Signed)
Patient is coping well with stay, understands safety precations.

## 2018-03-03 NOTE — Plan of Care (Signed)
Patient voiding well via condom catheter.

## 2018-03-03 NOTE — Progress Notes (Signed)
PT Cancellation Note  Patient Details Name: Austin Leonard MRN: [id number] DOB: 08-Sep-1966   Cancelled Treatment:    Reason Eval/Treat Not Completed: Active bedrest order  Will await increase in activity orders prior to initiation of evaluation. Note mobilization recommended in MD note 03/02/18.   Van ClinesHolly Josephine Wooldridge, South CarolinaPT  Acute Rehabilitation Services Pager [phone] Office [phone]      Levi AlandHolly H Alsha Meland 03/03/2018, 7:03 AM

## 2018-03-03 NOTE — Progress Notes (Signed)
Pt seen and examined. No issues overnight. No complaints this am. Wife at bedside.  EXAM: Temp:  [98.4 F (36.9 C)-99.7 F (37.6 C)] 98.4 F (36.9 C) (04/14 0800) Pulse Rate:  [69-107] 77 (04/14 0900) Resp:  [11-28] 16 (04/14 0900) BP: (123-180)/(76-122) 153/92 (04/14 0900) SpO2:  [94 %-100 %] 98 % (04/14 0900) Intake/Output      04/13 0701 - 04/14 0700 04/14 0701 - 04/15 0700   I.V. (mL/kg) 1210 (11.6) 100 (1)   Total Intake(mL/kg) 1210 (11.6) 100 (1)   Urine (mL/kg/hr) 3800 (1.5)    Drains 276 23   Stool     Total Output 4076 23   Net -2866 +77         Awake, alert, oriented Speech fluent Good strength throughout Wounds c/d/i EVD functional, blood tinged CSF  LABS: Lab Results  Component Value Date   CREATININE 1.02 03/01/2018   BUN 13 03/01/2018   NA 137 03/01/2018   K 4.0 03/01/2018   CL 107 03/01/2018   CO2 20 (L) 03/01/2018   Lab Results  Component Value Date   WBC 16.7 (H) 02/28/2018   HGB 14.2 02/28/2018   HCT 43.2 02/28/2018   MCV 85.2 02/28/2018   PLT 210 02/28/2018    IMPRESSION: - 52 y.o. male POD# 4 s/p EVD and suboccip crani for hemorrhage, doing well  PLAN: - Cont EVD drainage for now. Likely begin to challenge EVD later this week. - Cont supportive care.

## 2018-03-03 NOTE — Evaluation (Signed)
Clinical/Bedside Swallow Evaluation Patient Details  Name: Austin Leonard MRN: [id number] Date of Birth: [birth year]  Today's Date: 03/03/2018 Time: SLP Start Time (ACUTE ONLY): 0800 SLP Stop Time (ACUTE ONLY): 0815 SLP Time Calculation (min) (ACUTE ONLY): 15 min  Past Medical History:  Past Medical History:  Diagnosis Date  . Hypertension    Past Surgical History:  Past Surgical History:  Procedure Laterality Date  . CRANIOTOMY N/A 02/27/2018   Procedure: CRANIECTOMY FOR SUBOCCIPTAL HEMORRHAGE;  Surgeon: Tia AlertJones, [name], MD;  [facility];  Service: Neurosurgery;  Laterality: N/A;  . FACIAL LACERATION REPAIR Left 02/27/2018   Procedure: EYELID  LACERATION CLOSURE;  Surgeon: Flo ShanksWolicki, Karol, MD;  [facility];  Service: ENT;  Laterality: Left;   HPI:  52 year old male who was noted to have dizziness and headache and not feeling well earlier today. Wife came back home to find the house in disarray and him less responsive. He was brought to emergency department with these findings. 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 The patient has a left orbital fracture also. ENT has been consulted. Patient is lethargic but arousable and cannot cooperate with history and physical very well.   Patient is s/p frontal ventriculostomy placement and sub-occipital craniectomy.   Chest xray is showing non specific basilar interstitial opacity with lungs ? vascular congestion without edema.  Most recent head CT is showing evacuatin of left cerebellar hematoma.     Assessment / Plan / Recommendation Clinical Impression  Clinical swallowing evaluation was completed using thin liquids and dry solids.  Nursing reporting no obvious issues with intake.  The patient does not endorse any swallowing issues.  Oral mechanism was completed and unremarkable.  All structures and function were adequate.  The patient's oral and pharyngeal swallow appeared to be functional.   Swallow trigger appeared to be timely and hyo-laryngeal excursion was appreciated to palpation.  No overt s/s of aspiration were seen.  In addition, the patient passed the [facility] Swallowing Protocol suggesting the low likelihood for aspiration.  Recommend a regular diet with thin liquids.  ST follow up is not indicated.   SLP Visit Diagnosis: Dysphagia, unspecified (R13.10)    Aspiration Risk  No limitations    Diet Recommendation   Regular with thin liquids.  Medication Administration: Whole meds with liquid    Other  Recommendations Oral Care Recommendations: Oral care BID   Follow up Recommendations None        Swallow Study   General Date of Onset: 02/27/18 HPI: 52 year old male who was noted to have dizziness and headache and not feeling well earlier today. Wife came back home to find the house in disarray and him less responsive. He was brought to emergency department with these findings. 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 The patient has a left orbital fracture also. ENT has been consulted. Patient is lethargic but arousable and cannot cooperate with history and physical very well.   Patient is s/p frontal ventriculostomy placement and sub-occipital craniectomy.   Chest xray is showing non specific basilar interstitial opacity with lungs ? vascular congestion without edema.  Most recent head CT is showing evacuatin of left cerebellar hematoma.   Type of Study: Bedside Swallow Evaluation Previous Swallow Assessment: None [facility]. Diet Prior to this Study: Regular;Thin liquids Temperature Spikes Noted: No History of Recent Intubation: Yes Length of Intubations (days): (16 hours) Date extubated: 02/28/18 Behavior/Cognition: Alert;Cooperative;Pleasant mood Oral  Cavity Assessment: Within Functional Limits Oral Care Completed by SLP: No Oral Cavity - Dentition: Adequate natural dentition Vision: Functional for  self-feeding Self-Feeding Abilities: Able to feed self Patient Positioning: Upright in bed Baseline Vocal Quality: Normal Volitional Cough: Weak Volitional Swallow: Able to elicit    Oral/Motor/Sensory Function Overall Oral Motor/Sensory Function: Within functional limits   Ice Chips Ice chips: Not tested   Thin Liquid Thin Liquid: Within functional limits Presentation: Cup;Self Fed;Straw    Nectar Thick Nectar Thick Liquid: Not tested   Honey Thick Honey Thick Liquid: Not tested   Puree Puree: Not tested   Solid   GO   Solid: Within functional limits Presentation: Self Fed       Dimas Aguas, MA, CCC-SLP Acute Rehab SLP [phone] Fleet Contras 03/03/2018,8:38 AM

## 2018-03-04 NOTE — Progress Notes (Signed)
PT Cancellation Note  Patient Details Name: Austin Leonard MRN: [id number] DOB: 11/09/1966   Cancelled Treatment:    Reason Eval/Treat Not Completed: Active bedrest order   Charlyne Robertshaw B Barrett Goldie 03/04/2018, 7:07 AM Delaney MeigsMaija Tabor Deaisha Welborn, PT [phone]

## 2018-03-04 NOTE — Progress Notes (Signed)
Neurosurgery Progress Note  No issues overnight. Feels well Denies any concerns  EXAM:  BP 127/89   Pulse 95   Temp 97.9 F (36.6 C) (Axillary)   Resp 14   Ht 5\' 6"  (1.676 m)   Wt 104 kg (229 lb 4.5 oz)   SpO2 100%   BMI 37.01 kg/m   Awake, alert, oriented  Speech fluent, appropriate  MAEW with good strength CN grossly intact Does have left eyelid lac causing difficulties with examination. Otherwise, EOMI EVD functioning with blood tinged CSF  IMPRESSION/PLAN 52 y.o. male POD#5 s/p EVD and suboccip crani for hemorrhage, doing well  Will continue EVD drainage for now. Will likely begin challenging later this week Continue current care otherwise

## 2018-03-04 NOTE — Evaluation (Signed)
Physical Therapy Evaluation Patient Details Name: Austin Leonard MRN: [id number] DOB: 25-Dec-1965 Today's Date: 03/04/2018   History of Present Illness  52 yo admitted with left cerebellar hemorrhage s/p crani and EVD. PMHx: HTN  Clinical Impression  Pt very eager to mobilize and toilet. Pt with decreased balance, activity tolerance and functional mobility who will benefit from acute therapy to maximize mobility, gait and function to decrease burden of care. Pt educated for need for assist at all times due to balance deficits and EVD. Wife present throughout session.   BP 176/114     Follow Up Recommendations Supervision/Assistance - 24 hour;Outpatient PT    Equipment Recommendations  Other (comment)(TBD)    Recommendations for Other Services       Precautions / Restrictions Precautions Precautions: Fall Precaution Comments: EVD      Mobility  Bed Mobility Overal bed mobility: Needs Assistance Bed Mobility: Supine to Sit     Supine to sit: Min guard     General bed mobility comments: cues for safety with guarding for lines, HOB elevated  Transfers Overall transfer level: Needs assistance   Transfers: Sit to/from Stand Sit to Stand: Min guard         General transfer comment: guarding for safety and balance  Ambulation/Gait Ambulation/Gait assistance: Min assist Ambulation Distance (Feet): 150 Feet Assistive device: 1 person hand held assist Gait Pattern/deviations: Step-through pattern;Decreased stride length   Gait velocity interpretation: <1.8 ft/sec, indicate of risk for recurrent falls General Gait Details: pt with slow cautious gait with tendency for pitching forward, pt with cues for posture and increased stride LOB x 3 with assist to recover with increased instability with increased stride  Stairs            Wheelchair Mobility    Modified Rankin (Stroke Patients Only) Modified Rankin (Stroke Patients Only) Pre-Morbid Rankin Score: No  symptoms Modified Rankin: Moderately severe disability     Balance Overall balance assessment: Needs assistance   Sitting balance-Leahy Scale: Good       Standing balance-Leahy Scale: Fair                               Pertinent Vitals/Pain Pain Assessment: No/denies pain    Home Living Family/patient expects to be discharged to:: Private residence Living Arrangements: Spouse/significant other Available Help at Discharge: Family;Available 24 hours/day Type of Home: Apartment Home Access: Stairs to enter   [employer] of Steps: 3rd floor Home Layout: One level Home Equipment: None Additional Comments: 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    Prior Function Level of Independence: Independent               Higher education careers adviser        Extremity/Trunk Assessment   Upper Extremity Assessment Upper Extremity Assessment: Defer to OT evaluation    Lower Extremity Assessment Lower Extremity Assessment: Overall WFL for tasks assessed    Cervical / Trunk Assessment Cervical / Trunk Assessment: Normal  Communication   Communication: No difficulties  Cognition Arousal/Alertness: Awake/alert Behavior During Therapy: WFL for tasks assessed/performed Overall Cognitive Status: Within Functional Limits for tasks assessed                                        General Comments      Exercises     Assessment/Plan  PT Assessment Patient needs continued PT services  PT Problem List Decreased mobility;Decreased safety awareness;Decreased activity tolerance;Decreased balance;Decreased knowledge of use of DME       PT Treatment Interventions Gait training;Patient/family education;Balance training;Stair training;Functional mobility training;Neuromuscular re-education;DME instruction;Therapeutic activities    PT Goals (Current goals can be found in the Care Plan section)  Acute Rehab PT Goals Patient Stated Goal: return to work  and writing PT Goal Formulation: With patient/family Time For Goal Achievement: 03/18/18 Potential to Achieve Goals: Good    Frequency Min 3X/week   Barriers to discharge   wife works but can take off for short term    Co-evaluation               AM-PAC PT "6 Clicks" Daily Activity  Outcome Measure Difficulty turning over in bed (including adjusting bedclothes, sheets and blankets)?: None Difficulty moving from lying on back to sitting on the side of the bed? : A Little Difficulty sitting down on and standing up from a chair with arms (e.g., wheelchair, bedside commode, etc,.)?: A Little Help needed moving to and from a bed to chair (including a wheelchair)?: A Little Help needed walking in hospital room?: A Little Help needed climbing 3-5 steps with a railing? : A Lot 6 Click Score: 18    End of Session Equipment Utilized During Treatment: Gait belt Activity Tolerance: Patient tolerated treatment well Patient left: Other (comment);with family/visitor present(with OT in bathroom) Nurse Communication: Mobility status;Precautions PT Visit Diagnosis: Other abnormalities of gait and mobility (R26.89);Difficulty in walking, not elsewhere classified (R26.2)    Time: [id number] PT Time Calculation (min) (ACUTE ONLY): 21 min   Charges:   PT Evaluation $PT Eval Moderate Complexity: 1 Mod     PT G Codes:        Delaney MeigsMaija Tabor Kealohilani Maiorino, PT [phone]   Jhanae Jaskowiak B Jamilette Suchocki 03/04/2018, 9:43 AM

## 2018-03-04 NOTE — Evaluation (Signed)
Occupational Therapy Evaluation Patient Details Name: Austin Leonard MRN: [id number] DOB: Nov 18, 1966 Today's Date: 03/04/2018    History of Present Illness 52 yo admitted with left cerebellar hemorrhage s/p crani and EVD. PMHx: HTN   Clinical Impression   PTA, pt was living with his wife and was independent and working. Pt currently requiring Min A for ADLs and functional mobility. Pt presenting with decreased standing balance, vision deficits (including blurry vision), and decreased cognition. Pt with good family support. Pt would benefit from further acute OT to facilitate safe dc. Recommend dc to home with follow up at neuro outpatient for further OT to optimize safety, independence with ADLs/IADLs, and return to PLOF.      Follow Up Recommendations  Outpatient OT;Supervision/Assistance - 24 hour(Neuro OP OT)    Equipment Recommendations  None recommended by OT    Recommendations for Other Services PT consult     Precautions / Restrictions Precautions Precautions: Fall Precaution Comments: EVD Restrictions Weight Bearing Restrictions: No      Mobility Bed Mobility Overal bed mobility: Needs Assistance Bed Mobility: Supine to Sit     Supine to sit: Min guard     General bed mobility comments: OOB on toilet with PT upon arrival  Transfers Overall transfer level: Needs assistance Equipment used: None Transfers: Sit to/from Stand Sit to Stand: Min assist         General transfer comment: Min A to steady in standing    Balance Overall balance assessment: Needs assistance Sitting-balance support: No upper extremity supported;Feet supported Sitting balance-Leahy Scale: Good Sitting balance - Comments: Able to bring ankles to knees for adjusting socks   Standing balance support: No upper extremity supported;During functional activity Standing balance-Leahy Scale: Fair Standing balance comment: Able to maintain static standing without UE support. Requiring  physical A for dynamic balance for funcitonal tasks                           ADL either performed or assessed with clinical judgement   ADL Overall ADL's : Needs assistance/impaired Eating/Feeding: Set up;Sitting   Grooming: Minimal assistance;Oral care;Standing Grooming Details (indicate cue type and reason): Min A for standing balance at sink due to lateral leans. Pt  Upper Body Bathing: Minimal assistance;Sitting   Lower Body Bathing: Minimal assistance;Sit to/from stand   Upper Body Dressing : Minimal assistance;Sitting   Lower Body Dressing: Minimal assistance;Sit to/from stand   Toilet Transfer: Minimal assistance;Ambulation;BSC;Grab bars   Toileting- Clothing Manipulation and Hygiene: Moderate assistance;Sit to/from stand Toileting - Clothing Manipulation Details (indicate cue type and reason): Mod A to perform toilet hygiene after BM. Pt attempting initial clean up. Decreased problem solving to know next sequencing steps to finish task. Assisting to make sure pt clean.      Functional mobility during ADLs: Minimal assistance(Single hand held A) General ADL Comments: Pt with decreased standing balance, cognition, and vision. Educating pt on precaution to not lean forward with head. Discussed that wife should drive till pt cleared by MD.     Vision Baseline Vision/History: No visual deficits Patient Visual Report: Blurring of vision Vision Assessment?: Yes Eye Alignment: Within Functional Limits(Difficulty to assess with left eye bruised. ) Tracking/Visual Pursuits: Able to track stimulus in all quads without difficulty Convergence: Within functional limits Visual Fields: No apparent deficits;Impaired-to be further tested in functional context(Pt reporting dark spot in left visual field) Additional Comments: Bruised left eye. Pt with blurry vision. Tracking to all visual field.  Pt reporting "black spot" in left visual field when tracking object from right to left      Perception     Praxis      Pertinent Vitals/Pain Pain Assessment: No/denies pain     Hand Dominance Right   Extremity/Trunk Assessment Upper Extremity Assessment Upper Extremity Assessment: Overall WFL for tasks assessed   Lower Extremity Assessment Lower Extremity Assessment: Defer to PT evaluation   Cervical / Trunk Assessment Cervical / Trunk Assessment: Normal   Communication Communication Communication: No difficulties   Cognition Arousal/Alertness: Awake/alert Behavior During Therapy: WFL for tasks assessed/performed Overall Cognitive Status: Impaired/Different from baseline Area of Impairment: Memory;Awareness;Problem solving;Following commands;Safety/judgement                     Memory: Decreased short-term memory(Recalling 1/3 ST words and then required Max cues) Following Commands: Follows one step commands with increased time;Follows multi-step commands with increased time;Follows multi-step commands inconsistently Safety/Judgement: Decreased awareness of safety Awareness: Emergent Problem Solving: Slow processing;Decreased initiation;Difficulty sequencing;Requires verbal cues;Requires tactile cues General Comments: Pt presenting with decreased ST memory, problem solving, and slower processing. Pt requiring increased time for money management question as well as Mod VCs to find correct answer. Pt recalling 1/3 ST memory words. 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 Written several movies        OT Problem List: Decreased activity tolerance;Impaired balance (sitting and/or standing);Impaired vision/perception;Decreased cognition;Decreased safety awareness;Decreased knowledge of use of DME or AE      OT Treatment/Interventions: Self-care/ADL training;Therapeutic exercise;Energy conservation;DME and/or AE instruction;Therapeutic activities;Visual/perceptual remediation/compensation;Patient/family education;Cognitive remediation/compensation    OT Goals(Current goals can be found in the care plan section) Acute Rehab OT Goals Patient Stated Goal: return to work and writing OT Goal Formulation: With patient/family Time For Goal Achievement: 03/18/18 Potential to Achieve Goals: Good ADL Goals Pt Will Perform Lower Body Dressing: with modified independence;sit to/from stand Pt Will Transfer to Toilet: with modified independence;ambulating;regular height toilet Pt Will Perform Toileting - Clothing Manipulation and hygiene: with modified independence;sit to/from stand Pt Will Perform Tub/Shower Transfer: Tub transfer;ambulating;with supervision Additional ADL Goal #1: Pt will perform four step trail making task with 1-2 cues  OT Frequency: Min 2X/week   Barriers to D/C:            Co-evaluation              AM-PAC PT "6 Clicks" Daily Activity     Outcome Measure Help from another person eating meals?: A Little Help from another person taking care of personal grooming?: A Little Help from another person toileting, which includes using toliet, bedpan, or urinal?: A Lot Help from another person bathing (including washing, rinsing, drying)?: A Lot Help from another person  to put on and  taking off regular upper body clothing?: A Little Help from another person to put on and taking off regular lower body clothing?: A Lot 6 Click Score: 15   End of Session Equipment Utilized During Treatment: Gait belt Nurse Communication: Mobility status  Activity Tolerance: Patient tolerated treatment well Patient left: in chair;with call bell/phone within reach;with family/visitor present  OT Visit Diagnosis: Unsteadiness on feet (R26.81);Other abnormalities of gait and mobility (R26.89);Other symptoms and signs involving cognitive function;Low vision, both eyes (H54.2)                Time: [id number] OT Time Calculation (min): 29 min Charges:  OT General Charges $OT Visit: 1 Visit OT Evaluation $OT Eval Moderate Complexity: 1 Mod OT Treatments $Self Care/Home Management : 8-22 mins G-Codes:     Rommie Dunn MSOT, OTR/L Acute Rehab Pager: [phone] Office: [phone]  Theodoro GristCharis M Matilda Fleig 03/04/2018, 11:22 AM

## 2018-03-04 NOTE — Progress Notes (Signed)
OT Cancellation Note  Patient Details Name: Elissa LovettDavan L Osmon MRN: [id number] DOB: Jul 14, 1966   Cancelled Treatment:    Reason Eval/Treat Not Completed: Active bedrest order. Will await increase in activity orders prior to initiating evaluation.   Doristine Sectionharity A Emmilyn Crooke, MS OTR/L  Pager: [phone]   Doristine SectionCharity A Vian Fluegel 03/04/2018, 7:35 AM

## 2018-03-05 NOTE — Progress Notes (Addendum)
  Speech Language Pathology Treatment: Cognitive-Linquistic  Patient Details Name: Elissa LovettDavan L Mitchum MRN: [id number] DOB: [birth year] Today's Date: 03/05/2018 Time: 1445-1500 SLP Time Calculation (min) (ACUTE ONLY): 15 min  Assessment / Plan / Recommendation Clinical Impression  Pt was seen for skilled ST targeting cognitive goals.  Pt was received from OT therapy session, partially reclined in bed, drowsy, but agreeable to participate in ST.  Pt oriented x4 independently.  SLP facilitated the session with conversations regarding discharge planning and awareness of current limitations.  Pt was able to independently identify activities of daily living for which he currently needs assistance; however, he needed min assist question cues to anticipate how these current limitations will impact his independence in the home environment and generate appropriate solutions.  Discussed ongoing plan of care for ST interventions for maximizing pt's functional independence.  All questions were answered to pt's satisfaction at this time.  Continue per current plan of care.    HPI HPI: 52 year old male who was noted to have dizziness and headache and not feeling well earlier today. Wife came back home to find the house in disarray and him less responsive. He was brought to emergency department with these findings. 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 The patient has a left orbital fracture also. ENT has been consulted. Patient is lethargic but arousable and cannot cooperate with history and physical very well.   Patient is s/p frontal ventriculostomy placement and sub-occipital craniectomy.   Chest xray is showing non specific basilar interstitial opacity with lungs ? vascular congestion without edema.  Most recent head CT is showing evacuatin of left cerebellar hematoma.        SLP Plan  Continue with current plan of care       Recommendations                    Follow up Recommendations: Outpatient SLP;24 hour supervision/assistance SLP Visit Diagnosis: Cognitive communication deficit (Q46.962(R41.841) Plan: Continue with current plan of care       GO                Tayra Dawe, Melanee Spryicole L 03/05/2018, 3:02 PM

## 2018-03-05 NOTE — Progress Notes (Signed)
Physical Therapy Treatment Patient Details Name: Austin Leonard MRN: [id number] DOB: Jan 03, 1966 Today's Date: 03/05/2018    History of Present Illness 52 yo admitted with left cerebellar hemorrhage s/p crani and EVD. PMHx: HTN    PT Comments    Pt making steady improvements.  Emphasis on static and dynamic balance, gait with and without AD and challenges to balance including increasing speed and scanning his enviroment.   Follow Up Recommendations  Supervision/Assistance - 24 hour;Outpatient PT     Equipment Recommendations  Other (comment)(TBA)    Recommendations for Other Services       Precautions / Restrictions Precautions Precautions: Fall Precaution Comments: EVD Restrictions Weight Bearing Restrictions: No    Mobility  Bed Mobility Overal bed mobility: Needs Assistance Bed Mobility: Rolling;Sidelying to Sit Rolling: Min guard Sidelying to sit: Min guard Supine to sit: Min guard     General bed mobility comments: cues for sequencing, min for helping scoot to EOB, no assist to roll or come up.  Transfers Overall transfer level: Needs assistance Equipment used: None Transfers: Sit to/from [employer] Sit to Stand: Min assist         General transfer comment: Min A to steady in standing  Ambulation/Gait Ambulation/Gait assistance: Min assist Ambulation Distance (Feet): 180 Feet Assistive device: 1 person hand held assist(pushing iv pole) Gait Pattern/deviations: Step-through pattern Gait velocity: slower Gait velocity interpretation: <1.8 ft/sec, indicate of risk for recurrent falls General Gait Details: pt's gait characterized by short choppy and guarded steps, slow cadence with mildly more instability without assist of the IV pole.   Stairs             Wheelchair Mobility    Modified Rankin (Stroke Patients Only) Modified Rankin (Stroke Patients Only) Pre-Morbid Rankin Score: No symptoms Modified Rankin: Moderately severe  disability     Balance Overall balance assessment: Needs assistance Sitting-balance support: No upper extremity supported;Feet supported Sitting balance-Leahy Scale: Good Sitting balance - Comments: Able to bring ankles to knees for adjusting socks   Standing balance support: No upper extremity supported;During functional activity Standing balance-Leahy Scale: Fair Standing balance comment: Able to maintain static standing without UE support. Requiring physical A for dynamic balance for funcitonal tasks                            Cognition Arousal/Alertness: Awake/alert Behavior During Therapy: WFL for tasks assessed/performed Overall Cognitive Status: (NT formally, but follow commands well in session) Area of Impairment: Memory;Awareness;Problem solving;Following commands;Safety/judgement                     Memory: Decreased short-term memory Following Commands: Follows one step commands with increased time Safety/Judgement: Decreased awareness of safety Awareness: Emergent Problem Solving: Slow processing;Requires verbal cues General Comments: [name] recall 3/3 grooming items to perform at sink      Exercises      General Comments        Pertinent Vitals/Pain Pain Assessment: No/denies pain    Home Living                      Prior Function            PT Goals (current goals can now be found in the care plan section) Acute Rehab PT Goals Patient Stated Goal: return to work and writing PT Goal Formulation: With patient/family Time For Goal Achievement: 03/18/18 Potential to Achieve Goals: Good  Progress towards PT goals: Progressing toward goals    Frequency    Min 3X/week      PT Plan Current plan remains appropriate    Co-evaluation              AM-PAC PT "6 Clicks" Daily Activity  Outcome Measure  Difficulty turning over in bed (including adjusting bedclothes, sheets and blankets)?: None Difficulty moving  from lying on back to sitting on the side of the bed? : A Little Difficulty sitting down on and standing up from a chair with arms (e.g., wheelchair, bedside commode, etc,.)?: A Little Help needed moving to and from a bed to chair (including a wheelchair)?: A Little Help needed walking in hospital room?: A Little Help needed climbing 3-5 steps with a railing? : A Little 6 Click Score: 19    End of Session   Activity Tolerance: Patient tolerated treatment well Patient left: in bed;with call bell/phone within reach Nurse Communication: Mobility status;Precautions PT Visit Diagnosis: Other abnormalities of gait and mobility (R26.89);Difficulty in walking, not elsewhere classified (R26.2)     Time: [id number] PT Time Calculation (min) (ACUTE ONLY): 31 min  Charges:  $Gait Training: 8-22 mins $Therapeutic Activity: 8-22 mins                    G Codes:       03/05/2018  [place] BingKen Annett Boxwell, PT [phone]  (pager)   Austin Leonard 03/05/2018, 4:11 PM

## 2018-03-05 NOTE — Progress Notes (Signed)
Neurosurgery Progress Note  No issues overnight.  Denies any headache, focal deficits  EXAM:  BP (!) 151/99   Pulse 89   Temp 97.6 F (36.4 C) (Oral)   Resp 14   Ht 5\' 6"  (1.676 m)   Wt 104 kg (229 lb 4.5 oz)   SpO2 97%   BMI 37.01 kg/m   Awake, alert, oriented  Speech fluent, appropriate  CN grossly intact  MAEW with good strength EVD functioning with blood tinged CSF  PLAN Stable this am Will likely challenge EVD tomorrow

## 2018-03-05 NOTE — Progress Notes (Signed)
Occupational Therapy Treatment Patient Details Name: Austin Leonard MRN: [id number] DOB: [birth year] Today's Date: 03/05/2018    History of present illness 52 yo admitted with left cerebellar hemorrhage s/p crani and EVD. PMHx: HTN   OT comments  Pt progressing towards established OT goals. Upon arrival, pt sidelying in bed and tired but agreeable to therapy. Pt continues to present with decreased balance as seen during grooming at sink and functional mobility. Pt requiring Min A for balance. Pt recalling 3/3 grooming tasks to perform at sink and requiring increased time during grooming for processing. Pt also reporting horizontal diplopia throughout session. Continue to recommend dc with follow up at neuro OP OT and will continue to follow acutely as admitted.    Follow Up Recommendations  Outpatient OT;Supervision/Assistance - 24 hour(Neuro OP OT)    Equipment Recommendations  None recommended by OT    Recommendations for Other Services PT consult    Precautions / Restrictions Precautions Precautions: Fall Precaution Comments: EVD Restrictions Weight Bearing Restrictions: No       Mobility Bed Mobility Overal bed mobility: Needs Assistance Bed Mobility: Rolling;Sidelying to Sit Rolling: Min guard Sidelying to sit: Min guard Supine to sit: Min guard     General bed mobility comments: MIn Guard for safety  Transfers Overall transfer level: Needs assistance Equipment used: None Transfers: Sit to/from [employer] Sit to Stand: Min assist         General transfer comment: Min A to steady in standing    Balance Overall balance assessment: Needs assistance Sitting-balance support: No upper extremity supported;Feet supported Sitting balance-Leahy Scale: Good Sitting balance - Comments: Able to bring ankles to knees for adjusting socks   Standing balance support: No upper extremity supported;During functional activity Standing balance-Leahy Scale:  Fair Standing balance comment: Min A for dynamic movement                           ADL either performed or assessed with clinical judgement   ADL Overall ADL's : Needs assistance/impaired Eating/Feeding: Set up;Sitting   Grooming: Minimal assistance;Oral care;Standing;Applying deodorant;Brushing hair Grooming Details (indicate cue type and reason): Continues to require Min A for standing balance. [name] recall 3/3 grooming tasks asked for him to perform at sink. Pt reporting he sees six soap containers when there are only four. Pt continue to report double vision. Pt demonstrating increased problem solving to wash his axillary area before applying deodorant even though he wasn't asked.  Upper Body Bathing: Minimal assistance;Standing Upper Body Bathing Details (indicate cue type and reason): Min A for standing balance. Pt making sure he has single UE supported on sink or wall when washing underarms.                         Functional mobility during ADLs: Minimal assistance(Single hand held A) General ADL Comments: Continue to present with poor balance, vision, and slow cognition. Pt with LOB (~3-4) during funcitonal mobiltiy to bathroom and back to bed. Pt requiring Min A for correction of LOB and very afraid of falls. Pt reporting diplopia during session.      Vision   Vision Assessment?: Yes Eye Alignment: Within Functional Limits(Difficulty to assess with left eye bruised. ) Tracking/Visual Pursuits: Able to track stimulus in all quads without difficulty Convergence: Within functional limits Visual Fields: No apparent deficits;Impaired-to be further tested in functional context(Pt reporting dark spot in left visual field) Diplopia  Assessment: Disappears with one eye closed;Objects split side to side;Present all the time/all directions Additional Comments: Pt reporting diplopia during session but inconsistance during testing. During grooming, pt seeing 6 soap  bottles when there were 4. Diplopia gone when closing either eye. COntinue to report blurry vision from bruised left eye   Perception     Praxis      Cognition Arousal/Alertness: Awake/alert Behavior During Therapy: WFL for tasks assessed/performed Overall Cognitive Status: (NT formally, but follow commands well in session) Area of Impairment: Memory;Awareness;Problem solving;Following commands;Safety/judgement                     Memory: Decreased short-term memory Following Commands: Follows one step commands with increased time Safety/Judgement: Decreased awareness of safety Awareness: Emergent Problem Solving: Slow processing;Requires verbal cues General Comments: [name] recall 3/3 grooming items to perform at sink. Requiring increased time during session for processing        Exercises     Shoulder Instructions       General Comments      Pertinent Vitals/ Pain       Pain Assessment: No/denies pain  Home Living                                          Prior Functioning/Environment              Frequency  Min 2X/week        Progress Toward Goals  OT Goals(current goals can now be found in the care plan section)  Progress towards OT goals: Progressing toward goals  Acute Rehab OT Goals Patient Stated Goal: return to work and writing OT Goal Formulation: With patient/family Time For Goal Achievement: 03/18/18 Potential to Achieve Goals: Good ADL Goals Pt Will Perform Lower Body Dressing: with modified independence;sit to/from stand Pt Will Transfer to Toilet: with modified independence;ambulating;regular height toilet Pt Will Perform Toileting - Clothing Manipulation and hygiene: with modified independence;sit to/from stand Pt Will Perform Tub/Shower Transfer: Tub transfer;ambulating;with supervision Additional ADL Goal #1: Pt will perform four step trail making task with 1-2 cues  Plan Discharge plan remains appropriate     Co-evaluation                 AM-PAC PT "6 Clicks" Daily Activity     Outcome Measure   Help from another person eating meals?: A Little Help from another person taking care of personal grooming?: A Little Help from another person toileting, which includes using toliet, bedpan, or urinal?: A Lot Help from another person bathing (including washing, rinsing, drying)?: A Lot Help from another person to put on and taking off regular upper body clothing?: A Little Help from another person to put on and taking off regular lower body clothing?: A Lot 6 Click Score: 15    End of Session Equipment Utilized During Treatment: Gait belt  OT Visit Diagnosis: Unsteadiness on feet (R26.81);Other abnormalities of gait and mobility (R26.89);Other symptoms and signs involving cognitive function;Low vision, both eyes (H54.2)   Activity Tolerance Patient tolerated treatment well   Patient Left in chair;with call bell/phone within reach;with family/visitor present   Nurse Communication Mobility status        Time: 1414-1440 OT Time Calculation (min): 26 min  Charges: OT General Charges $OT Visit: 1 Visit OT Treatments $Self Care/Home Management : 23-37 mins  Kona Yusuf MSOT, OTR/L  Acute Rehab Pager: [phone] Office: [phone]   Theodoro Grist Vahe Pienta 03/05/2018, 5:56 PM

## 2018-03-05 NOTE — Plan of Care (Signed)
Patient with no changes overnight. Patient alert and oriented and doing well, has no complaints. Wife at bedside. Care plan updated.

## 2018-03-06 ENCOUNTER — Encounter (HOSPITAL_COMMUNITY): Payer: Self-pay | Admitting: Emergency Medicine

## 2018-03-06 NOTE — Progress Notes (Signed)
No issues overnight.   EXAM:  BP (!) 147/82   Pulse 99   Temp 99.1 F (37.3 C) (Oral)   Resp 16   Ht 5\' 6"  (1.676 m)   Wt 104 kg (229 lb 4.5 oz)   SpO2 98%   BMI 37.01 kg/m   Awake, alert, oriented  Speech fluent, appropriate  CN grossly intact  5/5 BUE/BLE  EVD in place, blood tinged CSF  IMPRESSION:  52 y.o. male s/p craniectomy/EVD for cerebellar hemorrhage. Doing well.  PLAN: - Will raise EVD to 20mmHg - Ophthalmology consult, per Dr. Lazarus SalinesWolicki, may need repair of orbital fx.

## 2018-03-06 NOTE — Progress Notes (Signed)
Physical Therapy Treatment Patient Details Name: Austin Leonard MRN: [id number] DOB: 12-19-1965 Today's Date: 03/06/2018    History of Present Illness 52 yo admitted with left cerebellar hemorrhage s/p crani and EVD. PMHx: HTN    PT Comments    Improving steadily.  Intermittent diplopia, mildly unsteady overall, but managed all balance challenge without overt LOB   Follow Up Recommendations  Supervision/Assistance - 24 hour;Outpatient PT     Equipment Recommendations  Other (comment)    Recommendations for Other Services       Precautions / Restrictions      Mobility  Bed Mobility Overal bed mobility: Needs Assistance Bed Mobility: Supine to Sit     Supine to sit: Min guard     General bed mobility comments: MIn Guard for safety  Transfers Overall transfer level: Needs assistance Equipment used: None Transfers: Sit to/from Stand Sit to Stand: Min guard         General transfer comment: min guard for safety, bed and toilet  Ambulation/Gait Ambulation/Gait assistance: Min assist;Min guard Ambulation Distance (Feet): 600 Feet Assistive device: 1 person hand held assist(iv pole) Gait Pattern/deviations: Step-through pattern Gait velocity: slower   General Gait Details: Initially gait still short and choppy, but as he was challenged his gait relaxed a little.  Balance challenged with speed increase, scanning L/R, backing up.  pt was mildly unsteady at time, min guard at best.   Stairs Stairs: Yes Stairs assistance: Min guard Stair Management: One rail Left;Alternating pattern;Forwards Number of Stairs: 5 General stair comments: moderate use of the rail, safe if using the rail   Wheelchair Mobility    Modified Rankin (Stroke Patients Only) Modified Rankin (Stroke Patients Only) Pre-Morbid Rankin Score: No symptoms Modified Rankin: Moderately severe disability     Balance Overall balance assessment: Needs assistance Sitting-balance support: No upper  extremity supported;Feet supported Sitting balance-Leahy Scale: Good     Standing balance support: No upper extremity supported;During functional activity Standing balance-Leahy Scale: Fair                              Cognition Arousal/Alertness: Awake/alert Behavior During Therapy: WFL for tasks assessed/performed Overall Cognitive Status: Within Functional Limits for tasks assessed                         Following Commands: Follows one step commands with increased time              Exercises      General Comments        Pertinent Vitals/Pain Pain Assessment: No/denies pain    Home Living                      Prior Function            PT Goals (current goals can now be found in the care plan section) Acute Rehab PT Goals Patient Stated Goal: return to work and writing PT Goal Formulation: With patient/family Time For Goal Achievement: 50/29/19 Potential to Achieve Goals: Good Progress towards PT goals: Progressing toward goals    Frequency    Min 3X/week      PT Plan Current plan remains appropriate    Co-evaluation              AM-PAC PT "6 Clicks" Daily Activity  Outcome Measure  Difficulty turning over in bed (including adjusting bedclothes, sheets and blankets)?: None  Difficulty moving from lying on back to sitting on the side of the bed? : None Difficulty sitting down on and standing up from a chair with arms (e.g., wheelchair, bedside commode, etc,.)?: None Help needed moving to and from a bed to chair (including a wheelchair)?: A Little Help needed walking in hospital room?: A Little Help needed climbing 3-5 steps with a railing? : A Little 6 Click Score: 21    End of Session   Activity Tolerance: Patient tolerated treatment well Patient left: in chair;with call bell/phone within reach;with family/visitor present Nurse Communication: Mobility status;Precautions PT Visit Diagnosis: Other  abnormalities of gait and mobility (R26.89);Other symptoms and signs involving the nervous system (R29.898)     Time: 1610-9604 PT Time Calculation (min) (ACUTE ONLY): 25 min  Charges:  $Gait Training: 8-22 mins $Therapeutic Activity: 8-22 mins                    G Codes:       03/21/2018  [place] Bing, PT [phone]  (pager)   Austin Leonard 03/21/18, 4:34 PM

## 2018-03-06 NOTE — Progress Notes (Signed)
03/06/2018 9:34 AM  Celedonio SavageHolt, Austin [id number]  Post-Op Day 7    Temp:  [97.5 F (36.4 C)-99.1 F (37.3 C)] 99.1 F (37.3 C) (04/17 0800) Pulse Rate:  [68-94] 85 (04/17 0900) Resp:  [11-23] 13 (04/17 0900) BP: (106-164)/(48-104) 147/82 (04/17 0900) SpO2:  [92 %-100 %] 98 % (04/17 0900),     Intake/Output Summary (Last 24 hours) at 03/06/2018 0934 Last data filed at 03/06/2018 0800 Gross per 24 hour  Intake 450 ml  Output 2319 ml  Net -1869 ml    No results found for this or any previous visit (from the past 24 hour(s)).  SUBJECTIVE:  No LEFT orbital or facial pain.  Vision OS seems OK.  Notes sl diplopia but cannot specify when he has noted this.  Numbness LEFT cheek and upper lip noted.  OBJECTIVE:  Still with significant chemosis and subconjuctival hemorrhage OS.  Seems to have good ROM and good position of globe OS.  Lateral LEFT upper lid healing.  IMPRESSION:  LEFT orbital blowout fx without obvious dysconjugate gaze or enophthalmos.  PLAN:  Ophth consultation please.  If significant enophthalmos or restricted ROM, would consider repair of blow out fx.  I will check another maxillofacial CT in AM.  Would likely perform any needed repair next week.   Flo ShanksWOLICKI, Austin Leonard

## 2018-03-06 NOTE — Plan of Care (Signed)
Patient with no acute changes. Doing well. Care plan updated. Will continue to monitor.

## 2018-03-07 ENCOUNTER — Inpatient Hospital Stay (HOSPITAL_COMMUNITY): Payer: BLUE CROSS/BLUE SHIELD

## 2018-03-07 NOTE — Progress Notes (Signed)
Subjective: Patient reports no headaches or vision changes. Doing well.   Objective: Vital signs in last 24 hours: Temp:  [97.8 F (36.6 C)-99 F (37.2 C)] 97.9 F (36.6 C) (04/18 0800) Pulse Rate:  [62-92] 90 (04/18 1000) Resp:  [8-22] 13 (04/18 1000) BP: (119-170)/(69-118) 138/83 (04/18 1000) SpO2:  [95 %-100 %] 98 % (04/18 1000)  Intake/Output from previous day: 04/17 0701 - 04/18 0700 In: -  Out: 1201 [Urine:1150; Drains:51] Intake/Output this shift: Total I/O In: -  Out: 200 [Urine:200]  Neurologic: Grossly normal  Lab Results: Lab Results  Component Value Date   WBC 16.7 (H) 02/28/2018   HGB 14.2 02/28/2018   HCT 43.2 02/28/2018   MCV 85.2 02/28/2018   PLT 210 02/28/2018   Lab Results  Component Value Date   INR 0.98 02/27/2018   BMET Lab Results  Component Value Date   NA 137 03/01/2018   K 4.0 03/01/2018   CL 107 03/01/2018   CO2 20 (L) 03/01/2018   GLUCOSE 126 (H) 03/01/2018   BUN 13 03/01/2018   CREATININE 1.02 03/01/2018   CALCIUM 8.8 (L) 03/01/2018    Studies/Results: Ct Maxillofacial Wo Contrast  Result Date: 03/07/2018 CLINICAL DATA:  Follow-up exam for orbital floor fracture. EXAM: CT MAXILLOFACIAL WITHOUT CONTRAST TECHNIQUE: Multidetector CT imaging of the maxillofacial structures was performed. Multiplanar CT image reconstructions were also generated. COMPARISON:  Prior CT from 02/27/2018. FINDINGS: Osseous/Orbits: Acute left orbital floor blowout fracture again seen. Displaced fragments protrude inferiorly into the left maxillary sinus, just medial to the left infraorbital foramen. Fairly sizable portion of intraorbital fat protrudes through the fracture defect, extending approximately 17 mm inferior to the left orbital floor. Left inferior rectus muscle slightly protrudes through the fracture defect, similar to previous. Remainder of the bony left orbit intact. Left globe remains intact. Resolving pre and postseptal swelling and hemorrhage. No  confluent intraorbital/postseptal hematoma identified. No acute abnormality about the right globe or orbit. Remainder of the bones of the face intact. Scattered periapical lucencies noted about the dentition. Sinuses: Mild scattered ethmoidal sinus mucosal thickening. Paranasal sinuses are otherwise largely clear. Previously seen left maxillary hemosinus has resolved. Mastoid air cells and middle ear cavities are well pneumatized and clear. Soft tissues: Resolving left periorbital/preseptal contusion. No new soft tissue abnormality about the face. Limited intracranial: Postoperative changes from prior left cerebellar hematoma evacuation partially visualized. Residual intracranial hemorrhage noted. Right frontal approach EVD remains in place. IMPRESSION: 1. Left orbital floor blow-out fracture as detailed above, similar in appearance and position relative to prior CT from 02/27/2018. 2. Resolving pre and postseptal swelling/hemorrhage. Electronically Signed   By: Rise MuBenjamin  McClintock M.D.   On: 03/07/2018 02:49    Assessment/Plan: Drain was raised yesterday morning. Patient has done well and no headaches. Moderate amount of serosanguinous drainage noted on his pillow this morning. Will clamp IVC and repeat scan in AM. If scan is stable we will consider pulling the IVC tomorrow.    LOS: 8 days    Tiana LoftKimberly Hannah Maxx Calaway 03/07/2018, 10:16 AM

## 2018-03-08 ENCOUNTER — Inpatient Hospital Stay (HOSPITAL_COMMUNITY): Payer: BLUE CROSS/BLUE SHIELD

## 2018-03-08 NOTE — Progress Notes (Signed)
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 PMHx: HTN    PT Comments    Much improved.  Emphasis on more complex challenge to balance and stamina.   Follow Up Recommendations  Supervision/Assistance - 24 hour;Outpatient PT     Equipment Recommendations  Other (comment)    Recommendations for Other Services       Precautions / Restrictions      Mobility  Bed Mobility Overal bed mobility: Needs Assistance Bed Mobility: Supine to Sit Rolling: Supervision Sidelying to sit: Supervision          Transfers Overall transfer level: Needs assistance Equipment used: None Transfers: Sit to/from Stand Sit to Stand: Supervision            Ambulation/Gait Ambulation/Gait assistance: Min guard Ambulation Distance (Feet): 1000 Feet Assistive device: None Gait Pattern/deviations: Step-through pattern Gait velocity: moderate preferred, but can increase speed appreciably   General Gait Details: gait generally steady from the beginning, but guarded.  Pt challenged with scanning tasks throughout, speed changes, abrupt directional changes, stepping over objects, backing up and covering a lot of distance.  Pt showed some fatigue after >20 minutes walking, there were episodes of deviation, 1 self-recovered LOB, but much improved.   Stairs             Wheelchair Mobility    Modified Rankin (Stroke Patients Only) Modified Rankin (Stroke Patients Only) Pre-Morbid Rankin Score: No symptoms Modified Rankin: Moderate disability     Balance Overall balance assessment: Needs assistance Sitting-balance support: No upper extremity supported;Feet supported Sitting balance-Leahy Scale: Good     Standing balance support: No upper extremity supported;During functional activity Standing balance-Leahy Scale:  Fair                              Cognition Arousal/Alertness: Awake/alert Behavior During Therapy: WFL for tasks assessed/performed Overall Cognitive Status: Within Functional Limits for tasks assessed                         Following Commands: Follows one step commands consistently              Exercises      General Comments General comments (skin integrity, edema, etc.): Wife present and participative throughout      Pertinent Vitals/Pain Pain Assessment: No/denies pain    Home Living                      Prior Function            PT Goals (current goals can now be found in the care plan section) Acute Rehab PT Goals Patient Stated Goal: return to work and writing PT Goal Formulation: With patient/family Time For Goal Achievement: 03/18/18 Potential to Achieve Goals: Good Progress towards PT goals: Progressing toward goals    Frequency    Min 3X/week      PT Plan Current plan remains appropriate    Co-evaluation              AM-PAC PT "6 Clicks" Daily Activity  Outcome Measure  Difficulty turning over in bed (including adjusting bedclothes, sheets and blankets)?: None Difficulty moving from lying on back to sitting on the side of the bed? : None Difficulty sitting down  on and standing up from a chair with arms (e.g., wheelchair, bedside commode, etc,.)?: None Help needed moving to and from a bed to chair (including a wheelchair)?: A Little Help needed walking in hospital room?: A Little Help needed climbing 3-5 steps with a railing? : A Little 6 Click Score: 21    End of Session   Activity Tolerance: Patient tolerated treatment well Patient left: in chair;with call bell/phone within reach;with family/visitor present Nurse Communication: Mobility status;Precautions PT Visit Diagnosis: Other abnormalities of gait and mobility (R26.89);Other symptoms and signs involving the nervous system (R29.898)      Time: 1710-1734 PT Time Calculation (min) (ACUTE ONLY): 24 min  Charges:  $Gait Training: 8-22 mins $Therapeutic Activity: 8-22 mins                    G Codes:       Apr 06, 2018  Sibley Bing, PT [phone]  (pager)   Austin Leonard 06-Apr-2018, 5:47 PM

## 2018-03-08 NOTE — Progress Notes (Signed)
BP (!) 148/82   Pulse 81   Temp 98.6 F (37 C) (Oral)   Resp 12   Ht 5\' 6"  (1.676 m)   Wt 104 kg (229 lb 4.5 oz)   SpO2 98%   BMI 37.01 kg/m  Alert and oriented x 4 Speech is clear, moving all extremities Wound is clean, and dry Removed ventricular catheter today.

## 2018-03-08 NOTE — Progress Notes (Signed)
Patient ambulated a full lap around unit with 1 person steadying assist and 1 person to assist with EVD.  Still unsteady without holding on for assistance. Also did 3 stairs using rail assistance.  No acute changes overnight

## 2018-03-09 NOTE — Progress Notes (Signed)
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 PMHx: HTN   OT comments  [name] excellent progress toward goals.  He requires min guard assist for ADLs due to mild balance impairment.  He reports mild shadowing of objects, but demonstrates deficits with depth perception.  He also demonstrates mild higher level cognitive deficits.  Goals updated.   Recommend OPOT.    Follow Up Recommendations  Outpatient OT;Supervision/Assistance - 24 hour    Equipment Recommendations  None recommended by OT    Recommendations for Other Services      Precautions / Restrictions Precautions Precautions: Fall       Mobility Bed Mobility Overal bed mobility: Needs Assistance Bed Mobility: Supine to Sit;Sit to Supine Rolling: Supervision Sidelying to sit: Supervision          Transfers Overall transfer level: Needs assistance Equipment used: None Transfers: Sit to/from Starwood HotelsStand;Squat Pivot Transfers Sit to Stand: Supervision Stand pivot transfers: Min assist       General transfer comment: assist for safety     Balance Overall balance assessment: Needs assistance Sitting-balance support: No upper extremity supported;Feet supported Sitting balance-Leahy Scale: Good     Standing balance support: Single extremity supported;During functional activity Standing balance-Leahy Scale: Fair Standing balance comment: pt able to retrieve items from floor with close min guard assist and UE support                            ADL either performed or assessed with clinical judgement   ADL Overall ADL's : Needs assistance/impaired Eating/Feeding: Independent   Grooming: Wash/dry hands;Wash/dry face;Oral care;Brushing hair;Min guard;Standing   Upper Body Bathing: Set up;Sitting   Lower Body Bathing: Min  guard;Sit to/from stand   Upper Body Dressing : Set up;Sitting   Lower Body Dressing: Min guard;Sit to/from stand Lower Body Dressing Details (indicate cue type and reason): increased time and effort  Toilet Transfer: Min guard;Ambulation;Comfort height toilet;Grab bars   Toileting- Clothing Manipulation and Hygiene: Min guard;Sit to/from stand       Functional mobility during ADLs: Min guard       Vision   Vision Assessment?: Yes Eye Alignment: Impaired (comment)(mildly dysconjugate ) Tracking/Visual Pursuits: Able to track stimulus in all quads without difficulty Diplopia Assessment: Other (comment) Depth Perception: Undershoots;Overshoots Additional Comments: Pt reports only occasional shadowing of objects.  He is able to read Large print with OS only, but requires increased time and effot.  He reports undershooting and overshooting of objects.   Discussed mechanism of depth perception with pt and wife    Perception     Praxis      Cognition Arousal/Alertness: Awake/alert Behavior During Therapy: WFL for tasks assessed/performed Overall Cognitive Status: Impaired/Different from baseline Area of Impairment: Attention;Problem solving                   Current Attention Level: Alternating;Divided         Problem Solving: Requires verbal cues General Comments: requires min cues to alternate and divide attention, and min cues to perform serial 7 subtraction while ambulating         Exercises     Shoulder Instructions       General Comments wife prestent     Pertinent Vitals/ Pain       Pain Assessment:  No/denies pain  Home Living                                          Prior Functioning/Environment              Frequency  Min 2X/week        Progress Toward Goals  OT Goals(current goals can now be found in the care plan section)  Progress towards OT goals: Progressing toward goals  ADL Goals Additional ADL Goal #2:  Pt will be able to alternate and divide attention independently Additional ADL Goal #3: Pt will be able to target objects independently without overshooting or undershooting  Plan Discharge plan remains appropriate(goals updated )    Co-evaluation                 AM-PAC PT "6 Clicks" Daily Activity     Outcome Measure   Help from another person eating meals?: None Help from another person taking care of personal grooming?: A Little Help from another person toileting, which includes using toliet, bedpan, or urinal?: A Little Help from another person bathing (including washing, rinsing, drying)?: A Little Help from another person to put on and taking off regular upper body clothing?: A Little Help from another person to put on and taking off regular lower body clothing?: A Little 6 Click Score: 19    End of Session    OT Visit Diagnosis: Unsteadiness on feet (R26.81);Cognitive communication deficit (R41.841) Symptoms and signs involving cognitive functions: Nontraumatic intracerebral hemorrhage   Activity Tolerance Patient tolerated treatment well   Patient Left in bed;with call bell/phone within reach;with family/visitor present   Nurse Communication Mobility status        Time: 7829-5621 OT Time Calculation (min): 60 min  Charges: OT General Charges $OT Visit: 1 Visit OT Treatments $Self Care/Home Management : 23-37 mins $Therapeutic Activity: 23-37 mins  [employer], OTR/L [phone]    Jeani Hawking M 03/09/2018, 3:17 PM

## 2018-03-09 NOTE — Progress Notes (Signed)
Subjective: Patient reports doing well  Objective: Vital signs in last 24 hours: Temp:  [98.1 F (36.7 C)-98.9 F (37.2 C)] 98.1 F (36.7 C) (04/20 0800) Pulse Rate:  [50-93] 66 (04/20 0815) Resp:  [7-30] 8 (04/20 0815) BP: (111-170)/(61-110) 148/96 (04/20 0800) SpO2:  [89 %-100 %] 98 % (04/20 0815)  Intake/Output from previous day: 04/19 0701 - 04/20 0700 In: 240 [P.O.:240] Out: 1315 [Urine:1315] Intake/Output this shift: No intake/output data recorded.  Physical Exam: Awake, alert, conversant.  MAEW.  Up and ambulating without difficulty.  Dressings CDI.  Lab Results: No results for input(s): WBC, HGB, HCT, PLT in the last 72 hours. BMET No results for input(s): NA, K, CL, CO2, GLUCOSE, BUN, CREATININE, CALCIUM in the last 72 hours.  Studies/Results: Ct Head Wo Contrast  Result Date: 03/08/2018 CLINICAL DATA:  Follow-up examination status post craniotomy. EXAM: CT HEAD WITHOUT CONTRAST TECHNIQUE: Contiguous axial images were obtained from the base of the skull through the vertex without intravenous contrast. COMPARISON:  Prior CT from 02/28/2018. FINDINGS: Brain: Postoperative changes from prior left occipital craniectomy for evacuation of left cerebellar hematoma again seen. Pneumocephalus has largely resolved in the interim, although a few small foci persist. Persistent small volume scattered blood products along the operative bed within the left cerebellar hemisphere, slightly decreased from prior. Persistent intraventricular hemorrhage with blood in the fourth ventricle, also decreased from previous. No significant blood products now seen within the lateral or third ventricles. Right frontal approach EVD remains in place with tip at the anterior right lateral ventricle near the caudate head. Ventricular size is decreased from previous with no evidence for hydrocephalus. Evolving right frontal hematoma measures 2.5 x 0.7 cm, overall similar to previous. No new intracranial  hemorrhage. No acute large vessel territory infarct. No new extra-axial collection. Remote left thalamic lacunar infarcts noted. Vascular: No asymmetric hyperdense vessel. Skull: Left occipital craniectomy. Fluid collection along the craniectomy site measures 4.5 x 1.5 cm. Overlying skin staples remain in place. Right frontal burr hole with EVD in place. Sinuses/Orbits: Left orbital blowout fracture again noted. Paranasal sinuses and mastoid air cells remain largely clear. Other: None. IMPRESSION: 1. Postoperative changes from prior left occipital craniectomy for evacuation of left cerebellar hematoma, with nearly resolved postoperative pneumocephalus and resolving small volume blood products. 2. Interval decrease in intraventricular hemorrhage. Right frontal EVD remains in place with decreased ventricular size as compared to previous. Small right frontal hemorrhage associated with the EVD relatively similar. 3. No other new intracranial abnormality. Electronically Signed   By: Rise MuBenjamin  McClintock M.D.   On: 03/08/2018 06:10    Assessment/Plan: Patient is doing well.  Awaiting Ophthalmology consultation.  Transfer to progressive.      LOS: 10 days    Dorian HeckleSTERN,Anne Sebring D, MD 03/09/2018, 9:28 AM

## 2018-03-10 MED ORDER — TROPICAMIDE 1 % OP SOLN
2.0000 [drp] | Freq: Once | OPHTHALMIC | Status: AC
  Administered 2018-03-10: 2 [drp] via OPHTHALMIC
  Filled 2018-03-10: qty 3

## 2018-03-10 MED ORDER — PHENYLEPHRINE HCL 2.5 % OP SOLN
1.0000 [drp] | Freq: Once | OPHTHALMIC | Status: AC
  Administered 2018-03-10: 1 [drp] via OPHTHALMIC
  Filled 2018-03-10: qty 15

## 2018-03-10 NOTE — Progress Notes (Signed)
Subjective: Patient reports unchanged.  Still having double vision.  No headache.  Objective: Vital signs in last 24 hours: Temp:  [97.4 F (36.3 C)-99.1 F (37.3 C)] 97.4 F (36.3 C) (04/21 0400) Pulse Rate:  [59-95] 79 (04/21 0700) Resp:  [9-18] 14 (04/21 0700) BP: (127-170)/(82-115) 145/86 (04/21 0700) SpO2:  [87 %-100 %] 97 % (04/21 0700)  Intake/Output from previous day: 04/20 0701 - 04/21 0700 In: 220 [P.O.:220] Out: 750 [Urine:750] Intake/Output this shift: No intake/output data recorded.  Physical Exam: Patient is stable.  Alert, conversant.  MAEW.  Incision CDI.  Lab Results: No results for input(s): WBC, HGB, HCT, PLT in the last 72 hours. BMET No results for input(s): NA, K, CL, CO2, GLUCOSE, BUN, CREATININE, CALCIUM in the last 72 hours.  Studies/Results: No results found.  Assessment/Plan: Patient is stable.  Awaiting Ophthalmology evaluation.  Continuing with BP control.  Will need F/U with Primary physician for BP management.    LOS: 11 days    Dorian HeckleSTERN,Syvanna Ciolino D, MD 03/10/2018, 9:35 AM

## 2018-03-10 NOTE — Progress Notes (Signed)
1900: Handoff report received from RN. Pt resting in bed. Discussed plan of care for the shift; pt amenable to plan.  0000: Pt resting comfortably.  0400: Pt continues resting comfortably.  0700: Handoff report given to RN. No acute events overnight.  

## 2018-03-10 NOTE — Progress Notes (Signed)
Patient arrived to unit with wife and belongings at bedside. Oriented to room/unit. Vitals stable. No complaints  At this time. Continue to monitor patient.

## 2018-03-11 ENCOUNTER — Other Ambulatory Visit: Payer: Self-pay | Admitting: Ophthalmology

## 2018-03-11 NOTE — Progress Notes (Signed)
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 PMHx: HTN    PT Comments    Emphasis was placed on gait with balance challenges.  With shoes on, pt was not as steady and not as able to manage challenges to his balance and stability as on the previous treatment.  We compared mobility with socks only with similar results.  Pt reports sore hips and knees that was not reported in earlier sessions.  RN notified of changes.    Follow Up Recommendations  Outpatient PT;Supervision/Assistance - 24 hour     Equipment Recommendations  (TBA)    Recommendations for Other Services       Precautions / Restrictions Precautions Precautions: Fall Restrictions Weight Bearing Restrictions: No    Mobility  Bed Mobility Overal bed mobility: Needs Assistance Bed Mobility: Supine to Sit;Sit to Supine     Supine to sit: Supervision        Transfers Overall transfer level: Needs assistance Equipment used: (rail) Transfers: Sit to/from Stand Sit to Stand: Min guard;Min assist         General transfer comment: Unlike other session, pt staggered back against the bed upon standing up x2, needing stability assist then just the rail second trial.  Ambulation/Gait Ambulation/Gait assistance: Min assist Ambulation Distance (Feet): 400 Feet(with shoes and 75 with socks only) Assistive device: None Gait Pattern/deviations: Step-through pattern;Scissoring;Antalgic;Wide base of support Gait velocity: slower preferred, but mild improvement with mild increase in cadence.   General Gait Details: pt unable to manage the stability he showed last session.  Pt was mildly staggery overall, antalgic in nature, spending less time in stance on the left, increasingly wider BOS to compensate.  Challenges to balance, scanning, backing up  produced further instability.   Stairs             Wheelchair Mobility    Modified Rankin (Stroke Patients Only) Modified Rankin (Stroke Patients Only) Modified Rankin: Moderate disability(to 4)     Balance Overall balance assessment: Needs assistance   Sitting balance-Leahy Scale: Fair Sitting balance - Comments: not as able to adjust socks, unable to put on shoes.     Standing balance-Leahy Scale: Fair                              Cognition Arousal/Alertness: Awake/alert Behavior During Therapy: WFL for tasks assessed/performed Overall Cognitive Status: Within Functional Limits for tasks assessed                                        Exercises      General Comments        Pertinent Vitals/Pain Pain Assessment: Faces Faces Pain Scale: Hurts little more Pain Location: hips R>L and knees Pain Descriptors / Indicators: Aching;Sore Pain Intervention(s): Monitored during session    Home Living                      Prior Function            PT Goals (current goals can now be found in the care plan section) Acute Rehab PT Goals Patient Stated Goal: return to work and writing PT Goal Formulation: With patient/family Time For Goal Achievement:  03/18/18 Potential to Achieve Goals: Good Progress towards PT goals: Progressing toward goals    Frequency    Min 3X/week      PT Plan Current plan remains appropriate    Co-evaluation              AM-PAC PT "6 Clicks" Daily Activity  Outcome Measure  Difficulty turning over in bed (including adjusting bedclothes, sheets and blankets)?: None Difficulty moving from lying on back to sitting on the side of the bed? : None Difficulty sitting down on and standing up from a chair with arms (e.g., wheelchair, bedside commode, etc,.)?: A Little Help needed moving to and from a bed to chair (including a wheelchair)?: A Little Help needed walking in hospital room?: A  Little Help needed climbing 3-5 steps with a railing? : A Little 6 Click Score: 20    End of Session   Activity Tolerance: Patient tolerated treatment well Patient left: in chair;with call bell/phone within reach;with family/visitor present Nurse Communication: Mobility status PT Visit Diagnosis: Other abnormalities of gait and mobility (R26.89);Other symptoms and signs involving the nervous system (R29.898)     Time: [id number] PT Time Calculation (min) (ACUTE ONLY): 26 min  Charges:  $Gait Training: 8-22 mins $Therapeutic Activity: 8-22 mins                    G Codes:       03/11/2018  [place] BingKen Nicollette Wilhelmi, PT [phone]  (pager)   Eliseo GumKenneth V Kaleeya Hancock 03/11/2018, 5:42 PM

## 2018-03-11 NOTE — Progress Notes (Signed)
1900: Handoff report received from RN. Pt resting in bed. Discussed plan of care for the shift; pt amenable to plan.  0000: Pt resting comfortably.  0400: Pt continues resting comfortably.  0700: Handoff report given to RN. No acute events overnight.  

## 2018-03-11 NOTE — Progress Notes (Signed)
03/11/2018 10:20 AM  Austin Leonard, Austin Leonard [id number]      Temp:  [98.4 F (36.9 C)-98.9 F (37.2 C)] 98.7 F (37.1 C) (04/22 0400) Pulse Rate:  [64-102] 75 (04/22 0400) Resp:  [12-20] 12 (04/22 0400) BP: (125-162)/(73-117) 129/95 (04/22 0400) SpO2:  [85 %-100 %] 96 % (04/22 0400),     Intake/Output Summary (Last 24 hours) at 03/11/2018 1020 Last data filed at 03/10/2018 1900 Gross per 24 hour  Intake 240 ml  Output 575 ml  Net -335 ml    No results found for this or any previous visit (from the past 24 hour(s)).  SUBJECTIVE:  Occasional double vision  OBJECTIVE:  subconjuctival hemorrhage OS.  Vision intact OU.  ROM intact OU with conjugate gaze and no subjective diplopia.  Possible early enophthalmos OS.  IMPRESSION:  Healing.  No functional diplopia on confrontation.  Possible developing enophthalmos.    PLAN:  Await Ophth eval.  May need repair LEFT orbital blow out fx this week.  Flo ShanksWOLICKI, [name]

## 2018-03-11 NOTE — Progress Notes (Unsigned)
Consultation

## 2018-03-11 NOTE — Progress Notes (Signed)
Subjective: Patient reports doing well. Up and ambulating with PT. Denies any HA, N or V  Objective: Vital signs in last 24 hours: Temp:  [98.4 F (36.9 C)-98.9 F (37.2 C)] 98.9 F (37.2 C) (04/22 1126) Pulse Rate:  [64-94] 80 (04/22 1126) Resp:  [12-21] 14 (04/22 1500) BP: (129-158)/(82-104) 130/96 (04/22 1126) SpO2:  [85 %-100 %] 96 % (04/22 1126)  Intake/Output from previous day: 04/21 0701 - 04/22 0700 In: 240 [P.O.:240] Out: 1200 [Urine:1200] Intake/Output this shift: Total I/O In: 240 [P.O.:240] Out: 500 [Urine:500]  Neurologic: Grossly normal  Lab Results: Lab Results  Component Value Date   WBC 16.7 (H) 02/28/2018   HGB 14.2 02/28/2018   HCT 43.2 02/28/2018   MCV 85.2 02/28/2018   PLT 210 02/28/2018   Lab Results  Component Value Date   INR 0.98 02/27/2018   BMET Lab Results  Component Value Date   NA 137 03/01/2018   K 4.0 03/01/2018   CL 107 03/01/2018   CO2 20 (L) 03/01/2018   GLUCOSE 126 (H) 03/01/2018   BUN 13 03/01/2018   CREATININE 1.02 03/01/2018   CALCIUM 8.8 (L) 03/01/2018    Studies/Results: No results found.  Assessment/Plan: Doing well. Waiting ophthalmology. Wound is still CDI.   LOS: 12 days    Tiana LoftKimberly Hannah Lee Regional Medical CenterMeyran 03/11/2018, 4:40 PM

## 2018-03-11 NOTE — Progress Notes (Unsigned)
Ophtho Consultation for Trauma OS  Pt had trauma to left eye on Thursday.  CT confirmed floor fracture. Pt denies nausea/vomiting or diplopia.    VA: OD 20/40 OS 20/40 IOP: 20/21 EOM: Normal OU VF: normal OU Pupils: Equal and round OU  Exam:  LLL: normal OU C/S: OD normal, OS subconj heme K: normal OU AC normal OU L: normal OU  DFE: Macula normal OU ON: normal OU Vessels: normal OU Periphery: normal OU  A/P: Floor fracture OS without entrapment of extraocular muscles. No immediate treatment needed.  [facility].

## 2018-03-12 ENCOUNTER — Encounter (HOSPITAL_COMMUNITY): Payer: Self-pay | Admitting: Radiology

## 2018-03-12 ENCOUNTER — Inpatient Hospital Stay (HOSPITAL_COMMUNITY): Payer: BLUE CROSS/BLUE SHIELD

## 2018-03-12 NOTE — Progress Notes (Signed)
Physical Therapy Treatment Patient Details Name: Austin Leonard MRN: [id number] DOB: [birth year] Today's Date: 03/12/2018    History of Present Illness 52 yo admitted with left cerebellar hemorrhage s/p crani and EVD. PMHx: HTN    PT Comments    Pt still noticeably more unsteady today and yesterday than in the previous treatment session.  Pt suspects hip and knee stiffness a reasons for the instability and I'm skeptical.  Dr Yetta BarreJones notified.   Follow Up Recommendations  Outpatient PT;Supervision/Assistance - 24 hour     Equipment Recommendations  Other (comment)(TBD)    Recommendations for Other Services       Precautions / Restrictions Precautions Precautions: Fall    Mobility  Bed Mobility Overal bed mobility: Needs Assistance Bed Mobility: Rolling;Sidelying to Sit Rolling: Supervision Sidelying to sit: Supervision       General bed mobility comments: slow, but no assist needed  Transfers Overall transfer level: Needs assistance Equipment used: None Transfers: Sit to/from Stand Sit to Stand: Min guard;Min assist         General transfer comment: Again pt rared back over the bed on initial stand (first stand in hours).  Pt "placed blame" on stiffness.  pt cues to hold to stationary furniture until he felt safe to continue.  Ambulation/Gait Ambulation/Gait assistance: Min assist Ambulation Distance (Feet): 300 Feet Assistive device: None Gait Pattern/deviations: Step-through pattern Gait velocity: slower preferred, but mild improvement with mild increase in cadence.   General Gait Details: Eventhough pt able to walk a good distance, pt was unsteady overall with staggering, scissoring and otherwise wide BOS.  This instability is similar to 4/22 session, and much worse than the prior session..   Stairs             Wheelchair Mobility    Modified Rankin (Stroke Patients Only) Modified Rankin (Stroke Patients Only) Pre-Morbid Rankin Score: No  symptoms Modified Rankin: Moderate disability     Balance     Sitting balance-Leahy Scale: Fair       Standing balance-Leahy Scale: Fair                              Cognition Arousal/Alertness: Awake/alert Behavior During Therapy: WFL for tasks assessed/performed Overall Cognitive Status: Within Functional Limits for tasks assessed                                        Exercises      General Comments        Pertinent Vitals/Pain Pain Assessment: No/denies pain    Home Living                      Prior Function            PT Goals (current goals can now be found in the care plan section) Acute Rehab PT Goals Patient Stated Goal: return to work and writing PT Goal Formulation: With patient/family Time For Goal Achievement: 03/18/18 Potential to Achieve Goals: Good Progress towards PT goals: Not progressing toward goals - comment(has become more unsteady in the last 2-3 days)    Frequency    Min 3X/week      PT Plan Current plan remains appropriate    Co-evaluation              AM-PAC PT "6 Clicks" Daily Activity  Outcome Measure  Difficulty turning over in bed (including adjusting bedclothes, sheets and blankets)?: None Difficulty moving from lying on back to sitting on the side of the bed? : None Difficulty sitting down on and standing up from a chair with arms (e.g., wheelchair, bedside commode, etc,.)?: A Little Help needed moving to and from a bed to chair (including a wheelchair)?: A Little Help needed walking in hospital room?: A Little Help needed climbing 3-5 steps with a railing? : A Little 6 Click Score: 20    End of Session   Activity Tolerance: Patient tolerated treatment well Patient left: in chair;with call bell/phone within reach;with family/visitor present Nurse Communication: Mobility status PT Visit Diagnosis: Other abnormalities of gait and mobility (R26.89);Other symptoms and signs  involving the nervous system (R29.898)     Time: 8119-1478 PT Time Calculation (min) (ACUTE ONLY): 22 min  Charges:  $Gait Training: 8-22 mins                    G Codes:       04/07/2018  [name], PT [phone]  (pager)   [name] 04-07-18, 6:30 PM

## 2018-03-12 NOTE — Progress Notes (Signed)
Patient ID: Austin LovettDavan L Leonard, male   DOB: May 28, 1966, 52 y.o.   MRN: [id number] Subjective: Patient reports no double vision and no headache  Objective: Vital signs in last 24 hours: Temp:  [98.2 F (36.8 C)-99.4 F (37.4 C)] 99.4 F (37.4 C) (04/23 0400) Pulse Rate:  [69-93] 93 (04/23 0417) Resp:  [14-21] 20 (04/23 0417) BP: (125-171)/(88-98) 171/97 (04/23 0417) SpO2:  [92 %-100 %] 98 % (04/23 0417)  Intake/Output from previous day: 04/22 0701 - 04/23 0700 In: 1080 [P.O.:1080] Out: 2575 [Urine:2575] Intake/Output this shift: No intake/output data recorded.  Neurologic: Grossly normal  Lab Results: Lab Results  Component Value Date   WBC 16.7 (H) 02/28/2018   HGB 14.2 02/28/2018   HCT 43.2 02/28/2018   MCV 85.2 02/28/2018   PLT 210 02/28/2018   Lab Results  Component Value Date   INR 0.98 02/27/2018   BMET Lab Results  Component Value Date   NA 137 03/01/2018   K 4.0 03/01/2018   CL 107 03/01/2018   CO2 20 (L) 03/01/2018   GLUCOSE 126 (H) 03/01/2018   BUN 13 03/01/2018   CREATININE 1.02 03/01/2018   CALCIUM 8.8 (L) 03/01/2018    Studies/Results: No results found.  Assessment/Plan: Doing well, since ophthal has yet to see him in consult I have called Dr Ulice Boldillingham for input also. Appreciate Dr Raye Sorrowwolicki's care also.Pt ready for D/C home from my standpoint  Estimated body mass index is 37.01 kg/m as calculated from the following:   Height as of this encounter: 5\' 6"  (1.676 m).   Weight as of this encounter: 104 kg (229 lb 4.5 oz).    LOS: 13 days    Kenise Barraco S 03/12/2018, 8:11 AM

## 2018-03-12 NOTE — Progress Notes (Signed)
  Speech Language Pathology Treatment: Cognitive-Linquistic  Patient Details Name: Austin Leonard MRN: [id number] DOB: [birth year] Today's Date: 03/12/2018 Time: 1400-1430 SLP Time Calculation (min) (ACUTE ONLY): 30 min  Assessment / Plan / Recommendation Clinical Impression  Patient was sleeping when entering room, but woke and was agreeable to work with therapy. He was able to recall reason for being at hospital and the treatment he is receiving. Patient expressed he feels his language skills and cognition are back to normal. His processing time to questions was judged to be within normal limits. Patient also demostrated safety awareness in asking for assistance to restroom. He recalled he was having difficulty with balance yesterday when walking. Pt appears to be at baseline and will not require skilled speech and language therapy upon discharge. Patient is being discharged from speech therapy at this time.    HPI HPI: 52 year old male who was noted to have dizziness and headache and not feeling well earlier today. Wife came back home to find the house in [place] and him less responsive. He was brought to emergency department with these findings. 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 The patient has a left orbital fracture also. ENT has been consulted. Patient is lethargic but arousable and cannot cooperate with history and physical very well.   Patient is s/p frontal ventriculostomy placement and sub-occipital craniectomy.   Chest xray is showing non specific basilar interstitial opacity with lungs ? vascular congestion without edema.  Most recent head CT is showing evacuatin of left cerebellar hematoma.        SLP Plan   Patient has met all goals. DC from Speech/language therapy.                              GO                Austin Leonard Austin Leonard 03/12/2018, 2:32 PM

## 2018-03-12 NOTE — Progress Notes (Signed)
Patient ID: Austin LovettDavan L Haser, male   DOB: 1965-12-26, 52 y.o.   MRN: [id number] PT called and stated he has been a little more unsteady in his gait the lats 2 days, He denies HA, incision with small amount of drainge from superior aspect, no evidence infection, drainage looks serosanguinous. Will check CT head. Walked with him and he was fairly steady with mild wobbliness and needed no real assistance to get from chair to other side of bed

## 2018-03-12 NOTE — Progress Notes (Signed)
2024: On shift assessment, noted that pt's LT AC IV (saline-locked) had dried blood under the dressing and looked swollen. Could also see what appeared to be purulent drainage at insertion site. D/C'd IV and confirmed that purulent drainage was leaking from site. Tissue is hard around insertion area and skin is reddened. Pt stated that his arm is swollen and he has been getting chills. Vitals: Temp- 99.80F, HR- 96, BP- 139/87, O2- 98% RA. Entered consult for IV team to assess and also insert a new IV.   RN placed request for Wound care eval.   2140: Paged MD.   2145: On-call MD called back. Advised of situation. Per MD, we are to continue to monitor. No new orders given.

## 2018-03-13 MED ORDER — MUPIROCIN CALCIUM 2 % EX CREA
TOPICAL_CREAM | Freq: Every day | CUTANEOUS | Status: DC
  Administered 2018-03-13 – 2018-03-15 (×3): via TOPICAL
  Filled 2018-03-13: qty 15

## 2018-03-13 NOTE — Clinical Social Work Note (Signed)
Clinical Social Work Assessment  Patient Details  Name: Austin Leonard MRN: [id number] Date of Birth: May 30, 1966  Date of referral:  03/13/18               Reason for consult:  Facility Placement                Permission sought to share information with:  Oceanographeracility Contact Representative Permission granted to share information::  Yes, Verbal Permission Granted  Name::     Barrister's clerkegina  Agency::  SNF  Relationship::  wife  Contact Information:     Housing/Transportation Living arrangements for the past 2 months:  Skilled Building surveyorursing Facility Source of Information:  Patient Patient Interpreter Needed:  None Criminal Activity/Legal Involvement Pertinent to Current Situation/Hospitalization:  No - Comment as needed Significant Relationships:  Spouse Lives with:  Spouse Do you feel safe going back to the place where you live?  No Need for family participation in patient care:  Yes (Comment)(will need physical assist and supervision at dc)  Care giving concerns:  Pt normally independent at home but now unsteady following neuro surgery- lives with wife but wife works during the day and does not think he would have supervision during the day.   Social Worker assessment / plan:  CSW spoke with pt concerning PT recommendation for SNF.  Explained SNF and SNF referral process.  Employment status:  Retired Network engineernsurance information:  Managed Care PT Recommendations:  Skilled Nursing Facility Information / Referral to community resources:  Skilled Nursing Facility  Patient/Family's Response to care:  Pt agreeable to SNF placement if needed- understands that he might not be safe to take care of self at home.  Patient/Family's Understanding of and Emotional Response to Diagnosis, Current Treatment, and Prognosis:  Pt agreeable to whatever medical team is recommending- hopeful that he will be safe to return home soon.  Emotional Assessment Appearance:  Appears stated age Attitude/Demeanor/Rapport:     Affect (typically observed):  Appropriate Orientation:  Oriented to Self, Oriented to Place, Oriented to  Time, Oriented to Situation Alcohol / Substance use:  Not Applicable Psych involvement (Current and /or in the community):  No (Comment)  Discharge Needs  Concerns to be addressed:  Care Coordination Readmission within the last 30 days:  No Current discharge risk:  Physical Impairment Barriers to Discharge:  English as a second language teachernsurance Authorization, Continued Medical Work up   [employer], Arley Garant H, LCSW 03/13/2018, 4:10 PM

## 2018-03-13 NOTE — Progress Notes (Signed)
Patient ID: Austin LovettDavan L Leonard, male   DOB: May 13, 1966, 52 y.o.   MRN: [id number] Pt seen and examined. Has infected IV site in L antecubital space, will start abx. Wound CDI today. CT head reviewed - no HCP, cerebellum looks good, there is fluid at crani site, could be CSF or seroma. OI really want to watch him for a couple of more days given his ambulatory issues per PT and slight drainage form wound yesterday.

## 2018-03-13 NOTE — Consult Note (Addendum)
WOC Nurse wound consult note Reason for Consult: Consult requested to assess left arm previous IV infiltrate site, bedside nurse does not know what solution was infusing prior to the occurrence. Wound type: Full thickness wound over location; .2X.2X.2cm,  Wound bed: yellow moist woundbed, small amt tan drainage, hard to palpation with induration and edema surrounding the site to 3 cm Dressing procedure/placement/frequency: Bactroban to promote antimicrobial benefits.  Please refer to the IV team for further assessment and input. Topical treatment will be minimally effective to promote healing since problem is below the skin level. If site continues to decline, then consider surgical consult for possible I&D. Please re-consult if further assistance is needed.  Thank-you,  Cammie Mcgeeawn Dwanda Tufano MSN, RN, CWOCN, [place]CN-AP, CNS [phone]

## 2018-03-13 NOTE — Progress Notes (Signed)
Subjective: Patient reports no headaches, NV or vision changes.   Objective: Vital signs in last 24 hours: Temp:  [98.2 F (36.8 C)-99.4 F (37.4 C)] 99.4 F (37.4 C) (04/24 0734) Pulse Rate:  [85-99] 92 (04/24 0734) Resp:  [9-23] 17 (04/24 0734) BP: (133-152)/(63-104) 145/78 (04/24 0734) SpO2:  [92 %-99 %] 94 % (04/24 0734)  Intake/Output from previous day: 04/23 0701 - 04/24 0700 In: 480 [P.O.:480] Out: 225 [Urine:225] Intake/Output this shift: No intake/output data recorded.  Neurologic: Grossly normal  Lab Results: Lab Results  Component Value Date   WBC 16.7 (H) 02/28/2018   HGB 14.2 02/28/2018   HCT 43.2 02/28/2018   MCV 85.2 02/28/2018   PLT 210 02/28/2018   Lab Results  Component Value Date   INR 0.98 02/27/2018   BMET Lab Results  Component Value Date   NA 137 03/01/2018   K 4.0 03/01/2018   CL 107 03/01/2018   CO2 20 (L) 03/01/2018   GLUCOSE 126 (H) 03/01/2018   BUN 13 03/01/2018   CREATININE 1.02 03/01/2018   CALCIUM 8.8 (L) 03/01/2018    Studies/Results: Ct Head Wo Contrast  Result Date: 03/12/2018 CLINICAL DATA:  Unsteady gait for 2 days. Status post suboccipital craniotomy February 27, 2018. EXAM: CT HEAD WITHOUT CONTRAST TECHNIQUE: Contiguous axial images were obtained from the base of the skull through the vertex without intravenous contrast. COMPARISON:  CT HEAD February 27, 2018 and March 08, 2018. FINDINGS: BRAIN: LEFT cerebellar evacuation cavity with small amount of fluid in minimal residual gas. Decreased mass effect. Small volume residual fourth ventricle blood products without hydrocephalus. No overall parenchymal brain volume loss for age. RIGHT frontal lobe intraparenchymal hemorrhage was 7 x 25 mm, now 5 x 18 mm. Minimal blood products in bone fragments along RIGHT frontal catheter tract. Slight ex vacuo dilatation RIGHT lateral ventricle. No acute large vascular territory infarcts. No midline shift, mass effect or interval hemorrhage. Basal  cisterns are patent. VASCULAR: Bulbous appearance of RIGHT MCA bifurcation with punctate calcification. Trace calcific atherosclerosis carotid siphons. SKULL/SOFT TISSUES: No skull fracture. Status post LEFT suboccipital craniectomy, overlying 2.3 x 4.6 cm fluid collection was 1.5 x 4.5 cm. Mild to RIGHT frontal burr holes. Suboccipital soft tissue swelling with skin staples. RIGHT frontal soft tissue swelling with tiny bone fragments and skin staples. ORBITS/SINUSES: Old LEFT inferior orbital blowout fracture with chronic LEFT inferior rectus muscle entrapment.Mild paranasal sinus mucosal thickening without paranasal sinus air-fluid levels. Mastoid air cells are well aerated. OTHER: None. IMPRESSION: 1. Status post LEFT suboccipital craniectomy for cerebellar hematoma, increasing postoperative pseudomeningocele. Decreased LEFT cerebellar mass effect. 2. Small volume residual fourth ventricle and blood products, status post interval removal of RIGHT EVD. 3. Evolving, contracting small RIGHT frontal lobe hematoma. 4. Proximal RIGHT MCA bifurcation aneurysm. Recommend NONEMERGENT MRA versus CTA. Electronically Signed   By: Awilda Metroourtnay  Bloomer M.D.   On: 03/12/2018 22:40    Assessment/Plan: Alert and resting in bed. Patient was still a little unsteady on his feet yesterday evening with PT. Continue therapies. No drainage from surgical site, has been dry over night. Wound at old IV site hard, indurated and draining small amount of pus. Wound care nurse has assessed and suggested bacitracin for now.    LOS: 14 days    Tiana LoftKimberly Hannah Val Farnam 03/13/2018, 10:11 AM

## 2018-03-13 NOTE — Progress Notes (Addendum)
Occupational Therapy Treatment Patient Details Name: Austin Leonard MRN: [id number] DOB: 08-Jan-1966 Today's Date: 03/13/2018    History of present illness 52 yo admitted with left cerebellar hemorrhage s/p crani and EVD. PMHx: HTN   OT comments  This 52 yo male admitted and underwent above presents to acute OT with continued decrease in balance (better with RW) thus affecting his safety and independence when he is up on his feet for basic ADLs. He will continue to benefit from acute OT with follow up SNF to get back to his PLOF of being independent and working full time. Spoke with CM about recommendations below    Follow Up Recommendations  SNF;Supervision/Assistance - 24 hour          Precautions / Restrictions Precautions Precautions: Fall Restrictions Weight Bearing Restrictions: No       Mobility Bed Mobility Overal bed mobility: Independent                Transfers Overall transfer level: Needs assistance Equipment used: None Transfers: Sit to/from Stand Sit to Stand: Min guard         General transfer comment: Minguard A to ambulate in hallway with RW, min A without RW    Balance Overall balance assessment: Needs assistance Sitting-balance support: No upper extremity supported;Feet supported Sitting balance-Leahy Scale: Good         Standing balance comment: [name] statically stand with S, but needs min guard A for dynamic tasks                           ADL either performed or assessed with clinical judgement   ADL Overall ADL's : Needs assistance/impaired Eating/Feeding: Independent   Grooming: Wash/dry hands;Standing;Min guard               Lower Body Dressing: Minimal assistance Lower Body Dressing Details (indicate cue type and reason): min guard A sit<>stand; he states that he normally sit sideways on the bed to do his socks, then slides his feet in his untied shoes, and then finds some position that he can get in to tie  his shoes. Today he did manage to get his socks on and shoes with increased effort, re-positioning, and increased time; could not get them tied. Toilet Transfer: Min guard;RW;Comfort height toilet;Grab Agricultural consultant Details (indicate cue type and reason): Did better with balance with RW than without Toileting- Architect and Hygiene: Min guard;Sit to/from stand               Vision Baseline Vision/History: No visual deficits Patient Visual Report: No change from baseline Vision Assessment?: Yes Eye Alignment: Within Functional Limits Ocular Range of Motion: Within Functional Limits Alignment/Gaze Preference: Within Defined Limits Tracking/Visual Pursuits: Able to track stimulus in all quads without difficulty Saccades: Within functional limits Visual Fields: No apparent deficits Additional Comments: Left eye is really bloodshot, but pt does not report any issues with seeing          Cognition Arousal/Alertness: Awake/alert Behavior During Therapy: WFL for tasks assessed/performed Overall Cognitive Status: Within Functional Limits for tasks assessed                                 General Comments: of note pt was incontinent of urine upon my arrival with bed soakd and he was unaware  Pertinent Vitals/ Pain       Pain Assessment: Faces Faces Pain Scale: Hurts little more Pain Location: back, hips, knees (reports he has issues with pain in these areas off and on, but feels it is worse since he has been in the bed so much lately) Pain Descriptors / Indicators: Aching;Sore Pain Intervention(s): Monitored during session;Limited activity within patient's tolerance         Frequency  Min 2X/week        Progress Toward Goals  OT Goals(current goals can now be found in the care plan section)  Progress towards OT goals: Progressing toward goals     Plan Discharge plan needs to be updated       AM-PAC PT "6 Clicks"  Daily Activity     Outcome Measure   Help from another person eating meals?: None Help from another person taking care of personal grooming?: A Little Help from another person toileting, which includes using toliet, bedpan, or urinal?: A Little Help from another person bathing (including washing, rinsing, drying)?: A Little Help from another person to put on and taking off regular upper body clothing?: A Little Help from another person to put on and taking off regular lower body clothing?: A Little 6 Click Score: 19    End of Session Equipment Utilized During Treatment: Gait belt  OT Visit Diagnosis: Unsteadiness on feet (R26.81) Symptoms and signs involving cognitive functions: Nontraumatic intracerebral hemorrhage   Activity Tolerance Patient tolerated treatment well   Patient Left in chair;with call bell/phone within reach;with chair alarm set   Nurse Communication (pt was incontinent of urine upon my arrival, but was not aware)        Time: [id number] OT Time Calculation (min): 33 min  Charges: OT General Charges $OT Visit: 1 Visit OT Treatments $Self Care/Home Management : 23-37 mins Ignacia PalmaCathy Ninnie Fein, OTR/L [phone] 03/13/2018

## 2018-03-13 NOTE — Progress Notes (Signed)
Physical Therapy Treatment Patient Details Name: Austin LovettDavan L Leonard MRN: [id number] DOB: September 05, 1966 Today's Date: 03/13/2018    History of Present Illness 52 yo admitted with left cerebellar hemorrhage s/p crani and EVD. PMHx: HTN    PT Comments    Working on challenging balance during gait training.  Pt is still not able to be at home alone without assist when up.     Follow Up Recommendations  SNF;Other (comment);Supervision/Assistance - 24 hour(pt's wife is unable to be at home due to must work.)     Equipment Recommendations  Other (comment)    Recommendations for Other Services       Precautions / Restrictions Precautions Precautions: Fall Restrictions Weight Bearing Restrictions: No    Mobility  Bed Mobility Overal bed mobility: Independent             General bed mobility comments: slow, but no assist needed  Transfers Overall transfer level: Needs assistance Equipment used: None Transfers: Sit to/from Stand Sit to Stand: Min guard         General transfer comment: Minguard A to ambulate in hallway with RW, min A without RW  Ambulation/Gait Ambulation/Gait assistance: Min assist Ambulation Distance (Feet): 300 Feet Assistive device: None Gait Pattern/deviations: Step-through pattern Gait velocity: slower preferred, but mild improvement with mild increase in cadence. Gait velocity interpretation: 1.31 - 2.62 ft/sec, indicative of limited community ambulator General Gait Details: pt mildly improved over last treatment, but still unsteady overall needing assist.  Did not use AD due to working on challenging balance, through scanning ,abrupt direction changes, backing up, changing speeds,   Stairs Stairs: Yes Stairs assistance: Min guard Stair Management: One rail Right;Step to pattern;Forwards;Sideways(side ways down with 2 hand sot 1 rail) Number of Stairs: 10 General stair comments: moderate use of the rail, safe if using the rail and with  caregiver   Wheelchair Mobility    Modified Rankin (Stroke Patients Only) Modified Rankin (Stroke Patients Only) Pre-Morbid Rankin Score: No symptoms Modified Rankin: Moderate disability     Balance Overall balance assessment: Needs assistance Sitting-balance support: No upper extremity supported;Feet supported Sitting balance-Leahy Scale: Good       Standing balance-Leahy Scale: Fair Standing balance comment: [name] statically stand with S, but needs min guard A for dynamic tasks                            Cognition Arousal/Alertness: Awake/alert Behavior During Therapy: WFL for tasks assessed/performed Overall Cognitive Status: Within Functional Limits for tasks assessed                                 General Comments: Noted incontinence issues today      Exercises      General Comments        Pertinent Vitals/Pain Pain Assessment: Faces Faces Pain Scale: Hurts little more Pain Location: back, hips, knees (reports he has issues with pain in these areas off and on, but feels it is worse since he has been in the bed so much lately) Pain Descriptors / Indicators: Aching;Sore Pain Intervention(s): Monitored during session    Home Living                      Prior Function            PT Goals (current goals can now be found in the  care plan section) Acute Rehab PT Goals Patient Stated Goal: return to work and writing PT Goal Formulation: With patient/family Time For Goal Achievement: 03/18/18 Potential to Achieve Goals: Good Progress towards PT goals: Progressing toward goals    Frequency    Min 3X/week      PT Plan Current plan remains appropriate    Co-evaluation              AM-PAC PT "6 Clicks" Daily Activity  Outcome Measure  Difficulty turning over in bed (including adjusting bedclothes, sheets and blankets)?: None Difficulty moving from lying on back to sitting on the side of the bed? :  None Difficulty sitting down on and standing up from a chair with arms (e.g., wheelchair, bedside commode, etc,.)?: A Little Help needed moving to and from a bed to chair (including a wheelchair)?: A Little Help needed walking in hospital room?: A Little Help needed climbing 3-5 steps with a railing? : A Little 6 Click Score: 20    End of Session   Activity Tolerance: Patient tolerated treatment well Patient left: with call bell/phone within reach;with family/visitor present;in bed Nurse Communication: Mobility status PT Visit Diagnosis: Other abnormalities of gait and mobility (R26.89);Other symptoms and signs involving the nervous system (W09.811)     Time: 9147-8295 PT Time Calculation (min) (ACUTE ONLY): 19 min  Charges:  $Gait Training: 8-22 mins                    G Codes:       04/02/18  Rahway Bing, PT [phone]  (pager)   Austin Leonard Austin Leonard 04/02/18, 5:15 PM

## 2018-03-13 NOTE — NC FL2 (Signed)
[place] MEDICAID FL2 LEVEL OF CARE SCREENING TOOL     IDENTIFICATION  Patient Name: Austin Leonard Birthdate: 11-02-1966 Sex: male Admission Date (Current Location): 02/27/2018  [facility] and [place] Number:  Producer, television/film/video and Address:  [facility] [place], [address] [place], [place] [address]      Provider Number: [id number]  Attending Physician Name and Address:  Tia Alert, MD  Relative Name and Phone Number:  Revel Stellmach, wife, [phone]    Current Level of Care: Hospital Recommended Level of Care: Skilled Nursing Facility Prior Approval Number:    Date Approved/Denied:   PASRR Number: [id number]  Discharge Plan: SNF    Current Diagnoses: Patient Active Problem List   Diagnosis Date Noted  . S/P craniotomy 02/27/2018    Orientation RESPIRATION BLADDER Height & Weight     Self, Time, Situation, Place  Normal Incontinent Weight: 229 lb 4.5 oz (104 kg) Height:  5\' 6"  (167.6 cm)  BEHAVIORAL SYMPTOMS/MOOD NEUROLOGICAL BOWEL NUTRITION STATUS      Continent Diet(see discharge summary)  AMBULATORY STATUS COMMUNICATION OF NEEDS Skin   Limited Assist Verbally Surgical wounds                       Personal Care Assistance Level of Assistance  Bathing, Feeding, Dressing Bathing Assistance: Limited assistance Feeding assistance: Independent Dressing Assistance: Limited assistance     Functional Limitations Info  Sight, Hearing, Speech Sight Info: Adequate Hearing Info: Adequate Speech Info: Adequate    SPECIAL CARE FACTORS FREQUENCY  PT (By licensed PT), OT (By licensed OT)     PT Frequency: 3x week OT Frequency: 2x week            Contractures Contractures Info: Not present    Additional Factors Info  Code Status, Allergies, Isolation Precautions Code Status Info: Full Code Allergies Info: No Known Allergies     Isolation Precautions Info: MRSA     Current Medications (03/13/2018):  This is the current  hospital active medication list Current Facility-Administered Medications  Medication Dose Route Frequency Provider Last Rate Last Dose  . acetaminophen (TYLENOL) tablet 650 mg  650 mg Oral Q4H PRN Maeola Harman, MD       Or  . acetaminophen (TYLENOL) suppository 650 mg  650 mg Rectal Q4H PRN Maeola Harman, MD      . hydrALAZINE (APRESOLINE) injection 15 mg  15 mg Intravenous Q8H PRN Maeola Harman, MD   15 mg at 03/12/18 0429  . hydrochlorothiazide (HYDRODIURIL) tablet 25 mg  25 mg Oral Daily Maeola Harman, MD   25 mg at 03/13/18 1009  . HYDROcodone-acetaminophen (NORCO/VICODIN) 5-325 MG per tablet 1 tablet  1 tablet Oral Q4H PRN Maeola Harman, MD      . labetalol (NORMODYNE,TRANDATE) injection 20 mg  20 mg Intravenous Q6H PRN Maeola Harman, MD   20 mg at 03/02/18 2322  . morphine 4 MG/ML injection 1-2 mg  1-2 mg Intravenous Q2H PRN Maeola Harman, MD      . mupirocin cream Idelle Jo) 2 %   Topical Daily Tia Alert, MD      . nebivolol (BYSTOLIC) tablet 10 mg  10 mg Oral Daily Maeola Harman, MD   10 mg at 03/13/18 1009  . ondansetron (ZOFRAN) tablet 4 mg  4 mg Oral Q4H PRN Maeola Harman, MD       Or  . ondansetron [facility]) injection 4 mg  4 mg Intravenous Q4H PRN Maeola Harman,  MD      . rosuvastatin (CRESTOR) tablet 10 mg  10 mg Oral Daily Maeola HarmanStern, Joseph, MD   10 mg at 03/13/18 1009  . senna (SENOKOT) tablet 8.6 mg  1 tablet Oral BID Maeola HarmanStern, Joseph, MD   8.6 mg at 03/13/18 1009     Discharge Medications: Please see discharge summary for a list of discharge medications.  Relevant Imaging Results:  Relevant Lab Results:   Additional Information SS# [id number]  Tyshun Tuckerman H Vel[place]se, [place]

## 2018-03-13 NOTE — Progress Notes (Signed)
PT/OT now recommending 24h supervision for safety and feel pt not safe to be by himself at home.  I called pt's wife, Rene KocherRegina to discuss this.  She states there is no one that can stay with pt at home, as [place], and she MUST work, as pt is out of work.  She is very overwhelmed by this, but understands pt may need short term SNF for rehab until pt can return home.  Will consult CSW to facilitate possible dc to SNF upon medical stability.  CSW to follow.  Neuro NP made aware of change in disposition.    Quintella BatonJulie W. Jetty Berland, RN, BSN  Trauma/Neuro ICU Case Manager [phone]

## 2018-03-14 LAB — CBC
HCT: 49.5 % (ref 39.0–52.0)
Hemoglobin: 15.9 g/dL (ref 13.0–17.0)
MCH: 27.5 pg (ref 26.0–34.0)
MCHC: 32.1 g/dL (ref 30.0–36.0)
MCV: 85.6 fL (ref 78.0–100.0)
PLATELETS: 223 10*3/uL (ref 150–400)
RBC: 5.78 MIL/uL (ref 4.22–5.81)
RDW: 14.9 % (ref 11.5–15.5)
WBC: 17.2 10*3/uL — AB (ref 4.0–10.5)

## 2018-03-14 LAB — BASIC METABOLIC PANEL
ANION GAP: 10 (ref 5–15)
BUN: 16 mg/dL (ref 6–20)
CO2: 28 mmol/L (ref 22–32)
Calcium: 8.7 mg/dL — ABNORMAL LOW (ref 8.9–10.3)
Chloride: 94 mmol/L — ABNORMAL LOW (ref 101–111)
Creatinine, Ser: 1.29 mg/dL — ABNORMAL HIGH (ref 0.61–1.24)
GFR calc Af Amer: >60 mL/min (ref >60)
GFR calc non Af Amer: >60 mL/min (ref >60)
Glucose, Bld: 96 mg/dL (ref 65–99)
POTASSIUM: 3.3 mmol/L — AB (ref 3.5–5.1)
SODIUM: 132 mmol/L — AB (ref 135–145)

## 2018-03-14 NOTE — Progress Notes (Signed)
Patient ID: Austin Leonard, male   DOB: 10/06/1966, 52 y.o.   MRN: [id number] Subjective: Patient reports no headache. States his vision is okay.  Objective: Vital signs in last 24 hours: Temp:  [97.8 F (36.6 C)-99.5 F (37.5 C)] 99 F (37.2 C) (04/25 0734) Pulse Rate:  [88-95] 92 (04/25 0734) Resp:  [10-21] 17 (04/25 0734) BP: (105-150)/(69-101) 132/91 (04/25 0734) SpO2:  [88 %-100 %] 100 % (04/25 0734)  Intake/Output from previous day: 04/24 0701 - 04/25 0700 In: 240 [P.O.:240] Out: 250 [Urine:250] Intake/Output this shift: No intake/output data recorded.  Neurologic: Grossly normal 2 in that exam but gait not tested this morning   Lab Results: Lab Results  Component Value Date   WBC 16.7 (H) 02/28/2018   HGB 14.2 02/28/2018   HCT 43.2 02/28/2018   MCV 85.2 02/28/2018   PLT 210 02/28/2018   Lab Results  Component Value Date   INR 0.98 02/27/2018   BMET Lab Results  Component Value Date   NA 137 03/01/2018   K 4.0 03/01/2018   CL 107 03/01/2018   CO2 20 (L) 03/01/2018   GLUCOSE 126 (H) 03/01/2018   BUN 13 03/01/2018   CREATININE 1.02 03/01/2018   CALCIUM 8.8 (L) 03/01/2018    Studies/Results: Ct Head Wo Contrast  Result Date: 03/12/2018 CLINICAL DATA:  Unsteady gait for 2 days. Status post suboccipital craniotomy February 27, 2018. EXAM: CT HEAD WITHOUT CONTRAST TECHNIQUE: Contiguous axial images were obtained from the base of the skull through the vertex without intravenous contrast. COMPARISON:  CT HEAD February 27, 2018 and March 08, 2018. FINDINGS: BRAIN: LEFT cerebellar evacuation cavity with small amount of fluid in minimal residual gas. Decreased mass effect. Small volume residual fourth ventricle blood products without hydrocephalus. No overall parenchymal brain volume loss for age. RIGHT frontal lobe intraparenchymal hemorrhage was 7 x 25 mm, now 5 x 18 mm. Minimal blood products in bone fragments along RIGHT frontal catheter tract. Slight ex vacuo dilatation  RIGHT lateral ventricle. No acute large vascular territory infarcts. No midline shift, mass effect or interval hemorrhage. Basal cisterns are patent. VASCULAR: Bulbous appearance of RIGHT MCA bifurcation with punctate calcification. Trace calcific atherosclerosis carotid siphons. SKULL/SOFT TISSUES: No skull fracture. Status post LEFT suboccipital craniectomy, overlying 2.3 x 4.6 cm fluid collection was 1.5 x 4.5 cm. Mild to RIGHT frontal burr holes. Suboccipital soft tissue swelling with skin staples. RIGHT frontal soft tissue swelling with tiny bone fragments and skin staples. ORBITS/SINUSES: Old LEFT inferior orbital blowout fracture with chronic LEFT inferior rectus muscle entrapment.Mild paranasal sinus mucosal thickening without paranasal sinus air-fluid levels. Mastoid air cells are well aerated. OTHER: None. IMPRESSION: 1. Status post LEFT suboccipital craniectomy for cerebellar hematoma, increasing postoperative pseudomeningocele. Decreased LEFT cerebellar mass effect. 2. Small volume residual fourth ventricle and blood products, status post interval removal of RIGHT EVD. 3. Evolving, contracting small RIGHT frontal lobe hematoma. 4. Proximal RIGHT MCA bifurcation aneurysm. Recommend NONEMERGENT MRA versus CTA. Electronically Signed   By: Awilda Metroourtnay  Bloomer M.D.   On: 03/12/2018 22:40    Assessment/Plan: Overall stable. Incision remains clean dry and intact. He is awake and alert and appropriate. We'll check some follow-up labs today. Awaiting skilled nursing facility  Estimated body mass index is 37.01 kg/m as calculated from the following:   Height as of this encounter: 5\' 6"  (1.676 m).   Weight as of this encounter: 104 kg (229 lb 4.5 oz).    LOS: 15 days    [name],[name] 03/14/2018,  11:14 AM

## 2018-03-14 NOTE — Social Work (Addendum)
CSW has offers for SNF, still a question of insurance approval for pt to discharge to SNF level care. Attempted to call pt wife at 11:12- unable to leave voicemail due to mailbox being full.   Will attempt again as CSW aware pt is medically stable for discharge.   1:00pm- CSW spoke with pt wife, she works in the [employer] office for [place] [facility] [place] and has time off. If pt is able to get authorization for SNF she is amenable [facility]. Pt wife states if there is any question of payment or copays that they are not able to pay those and pt will return home and she will keep her time off to take care of him before she goes back to work. Pt wife states that their apartment is accesible and easy to mobilize around once you get up the stairs. Pt wife following for BCBS updates. Pt wife aware she may have to take pt home with home health once he is medically clear if we have not heard a [place].   Doy HutchingIsabel H Jejuan Scala, LCSWA [facility] Clinical Social Work [phone]

## 2018-03-15 DIAGNOSIS — I614 Nontraumatic intracerebral hemorrhage in cerebellum: Secondary | ICD-10-CM | POA: Diagnosis not present

## 2018-03-15 DIAGNOSIS — M6281 Muscle weakness (generalized): Secondary | ICD-10-CM | POA: Diagnosis not present

## 2018-03-15 DIAGNOSIS — R260 Ataxic gait: Secondary | ICD-10-CM | POA: Diagnosis not present

## 2018-03-15 DIAGNOSIS — I6930 Unspecified sequelae of cerebral infarction: Secondary | ICD-10-CM | POA: Diagnosis not present

## 2018-03-15 DIAGNOSIS — I10 Essential (primary) hypertension: Secondary | ICD-10-CM | POA: Diagnosis not present

## 2018-03-15 DIAGNOSIS — Z9889 Other specified postprocedural states: Secondary | ICD-10-CM | POA: Diagnosis not present

## 2018-03-15 DIAGNOSIS — S0282XA Fracture of other specified skull and facial bones, left side, initial encounter for closed fracture: Secondary | ICD-10-CM | POA: Diagnosis not present

## 2018-03-15 DIAGNOSIS — R279 Unspecified lack of coordination: Secondary | ICD-10-CM | POA: Diagnosis not present

## 2018-03-15 DIAGNOSIS — S0232XS Fracture of orbital floor, left side, sequela: Secondary | ICD-10-CM | POA: Diagnosis not present

## 2018-03-15 DIAGNOSIS — E785 Hyperlipidemia, unspecified: Secondary | ICD-10-CM | POA: Diagnosis not present

## 2018-03-15 DIAGNOSIS — I509 Heart failure, unspecified: Secondary | ICD-10-CM | POA: Diagnosis not present

## 2018-03-15 DIAGNOSIS — I619 Nontraumatic intracerebral hemorrhage, unspecified: Secondary | ICD-10-CM | POA: Diagnosis not present

## 2018-03-15 MED ORDER — NEBIVOLOL HCL 10 MG PO TABS: 10.0000 mg | ORAL_TABLET | Freq: Every day | ORAL | 1 refills | Status: DC

## 2018-03-15 NOTE — Progress Notes (Signed)
Occupational Therapy Treatment Patient Details Name: Austin LovettDavan L Leonard MRN: [id number] DOB: Oct 20, 1966 Today's Date: 03/15/2018    History of present illness 52 yo admitted with left cerebellar hemorrhage s/p crani and EVD. PMHx: HTN   OT comments  This 52 yo male admitted and underwent above did better today than 2 days ago during OT session. Balance is improving and thus his safety is increasing with basic ADLs. He still struggles with higher level cognitive tasks especially when asking him to multi-task. He still will benefit from short rehab stay due to he is home alone during part of the day while his wife works.   Follow Up Recommendations  SNF;Supervision/Assistance - 24 hour    Equipment Recommendations  Tub/shower seat       Precautions / Restrictions Precautions Precautions: Fall Restrictions Weight Bearing Restrictions: No       Mobility Bed Mobility Overal bed mobility: Independent                Transfers Overall transfer level: Needs assistance Equipment used: Rolling walker (2 wheeled) Transfers: Sit to/from Stand Sit to Stand: Min guard         General transfer comment: Minguard A to ambulate in hallway with RW and then tried without RW and he was still at a min guard A level (but better than last session when he was Min A without RW)    Balance               Standing balance comment: In standing I had pt work on reaching in multiple directions with both hands at a face pace to tap my hands (intially did this with RW In front of him and then tried withouit RW--pt min guard A for all without LOB). He did have a hard time visually attending to the times I put both hands out at same time                           ADL either performed or assessed with clinical judgement   ADL Overall ADL's : Needs assistance/impaired                       Lower Body Dressing Details (indicate cue type and reason): Issued pt black elastic laces,  pt able to get shoes on much easier today due to not having to tie them Toilet Transfer: Min guard;RW;Grab bars                   Vision Baseline Vision/History: No visual deficits Patient Visual Report: No change from baseline            Cognition Arousal/Alertness: Awake/alert Behavior During Therapy: WFL for tasks assessed/performed                                   General Comments: Pt intially had a hard time naming the months in order as he was ambulating around the unit with me giving him directions as to what way to go at same time. I then had him workn on naming the months in reverse order as he was walking and I was giving him directions at the same time--he really struggled with this with long pauses. Had him do the same when seated and he still struggled but not as bad  Pertinent Vitals/ Pain       Pain Assessment: No/denies pain         Frequency  Min 2X/week        Progress Toward Goals  OT Goals(current goals can now be found in the care plan section)  Progress towards OT goals: Progressing toward goals     Plan Discharge plan remains appropriate       AM-PAC PT "6 Clicks" Daily Activity     Outcome Measure   Help from another person eating meals?: None Help from another person taking care of personal grooming?: A Little Help from another person toileting, which includes using toliet, bedpan, or urinal?: A Little Help from another person bathing (including washing, rinsing, drying)?: A Little Help from another person to put on and taking off regular upper body clothing?: A Little Help from another person to put on and taking off regular lower body clothing?: A Little 6 Click Score: 19    End of Session Equipment Utilized During Treatment: Gait belt(RW part of session)  OT Visit Diagnosis: Unsteadiness on feet (R26.81) Symptoms and signs involving cognitive functions: Nontraumatic intracerebral  hemorrhage   Activity Tolerance Patient tolerated treatment well   Patient Left in chair;with call bell/phone within reach;with chair alarm set           Time: 1023-1100 OT Time Calculation (min): 37 min  Charges: OT General Charges $OT Visit: 1 Visit OT Treatments $Self Care/Home Management : 8-22 mins $Therapeutic Activity: 8-22 mins  Ignacia Palma, OTR/L [phone] 03/15/2018

## 2018-03-15 NOTE — Clinical Social Work Placement (Signed)
   CLINICAL SOCIAL WORK PLACEMENT  NOTE [facility]  Date:  03/15/2018  Patient Details  Name: Austin Leonard MRN: [id number] Date of Birth: [birth year]  Clinical Social Work is seeking post-discharge placement for this patient at the Skilled  Nursing Facility level of care (*CSW will initial, date and re-position this form in  chart as items are completed):  Yes   Patient/family provided with [place] Clinical Social Work Department's list of facilities offering this level of care within the geographic area requested by the patient (or if unable, by the patient's family).  Yes   Patient/family informed of their freedom to choose among providers that offer the needed level of care, that participate in Medicare, Medicaid or managed care program needed by the patient, have an available bed and are willing to accept the patient.  Yes   Patient/family informed of [facility]'s ownership interest in [facility] and [facility], as well as of the fact that they are under no obligation to receive care at these facilities.  PASRR submitted to EDS on       PASRR number received on 03/13/18     Existing PASRR number confirmed on       FL2 transmitted to all facilities in geographic area requested by pt/family on 03/13/18     FL2 transmitted to all facilities within larger geographic area on       Patient informed that his/her managed care company has contracts with or will negotiate with certain facilities, including the following:        Yes   Patient/family informed of bed offers received.  [facility]     Physician recommends and patient chooses bed at      [facility].  Patient to be transferred to facility by wife in personal car     Patient family notified on 03/15/18 of transfer.  Name of family member notified:  Austin Leonard, wife     PHYSICIAN Please prepare priority discharge  summary, including medications, Please prepare prescriptions     Additional Comment:    _______________________________________________ Doy HutchingIsabel H Shayna Eblen, LCSWA 03/15/2018, 10:10 AM

## 2018-03-15 NOTE — Progress Notes (Signed)
Patient ID: Elissa LovettDavan L Cropper, male   DOB: [birth year], 52 y.o.   MRN: [id number] Subjective: Patient reports no headache  Objective: Vital signs in last 24 hours: Temp:  [97.6 F (36.4 C)-100 F (37.8 C)] 97.6 F (36.4 C) (04/26 0300) Pulse Rate:  [80-94] 83 (04/26 0300) Resp:  [17] 17 (04/25 1240) BP: (97-133)/(72-91) 113/90 (04/26 0300) SpO2:  [100 %] 100 % (04/25 0734)  Intake/Output from previous day: 04/25 0701 - 04/26 0700 In: 480 [P.O.:480] Out: 800 [Urine:800] Intake/Output this shift: No intake/output data recorded.  awake alert, MAE, wound CDI  Lab Results: Lab Results  Component Value Date   WBC 17.2 (H) 03/14/2018   HGB 15.9 03/14/2018   HCT 49.5 03/14/2018   MCV 85.6 03/14/2018   PLT 223 03/14/2018   Lab Results  Component Value Date   INR 0.98 02/27/2018   BMET Lab Results  Component Value Date   NA 132 (L) 03/14/2018   K 3.3 (L) 03/14/2018   CL 94 (L) 03/14/2018   CO2 28 03/14/2018   GLUCOSE 96 03/14/2018   BUN 16 03/14/2018   CREATININE 1.29 (H) 03/14/2018   CALCIUM 8.7 (L) 03/14/2018    Studies/Results: No results found.  Assessment/Plan: Stable, await placement determination  Estimated body mass index is 37.01 kg/m as calculated from the following:   Height as of this encounter: 5\' 6"  (1.676 m).   Weight as of this encounter: 104 kg (229 lb 4.5 oz).    LOS: 16 days    Saga Balthazar S 03/15/2018, 7:19 AM

## 2018-03-15 NOTE — Progress Notes (Signed)
Attempted report to SNF -  [facility] x 2 and spoke with receptionist Bicknellhristy. Transferred calls but Nurse unavailable to answer. Left message to receiving Nurse to call this writer @ [phone] if any information needed. Pt spouse at the bedside to transport patient to SNF. Carole Deere Ruthe MannanMawule Yousef Huge, BSN, RN

## 2018-03-15 NOTE — Social Work (Addendum)
Pt has received authorization for pt to [facility]. The pt and pt wife will have to contact BCBS using their card to assess any additional information regarding copays etc per SNF.   CSW called and updated wife, pt wife states she will transport pt. CSW to page MD to let them know placement has been secured.   Doy HutchingIsabel H Medardo Hassing, LCSWA [facility] Clinical Social Work [phone]

## 2018-03-15 NOTE — Care Management Note (Signed)
Case Management Note  Patient Details  Name: Elissa LovettDavan L Mejorado MRN: [id number] Date of Birth: 07-28-1966  Subjective/Objective:    52 yo admitted with left cerebellar hemorrhage s/p crani and EVD.  PTA, pt independent, lives with wife.                Action/Plan: PT/OT recommending SNF; CSW consulted to facilitate dc to SNF upon medical stability.  Expected Discharge Date:  03/15/18               Expected Discharge Plan:  Skilled Nursing Facility  In-House Referral:  Clinical Social Work  Discharge planning Services  CM Consult  Post Acute Care Choice:    Choice offered to:     DME Arranged:    DME Agency:     HH Arranged:    HH Agency:     Status of Service:  Completed, signed off  If discussed at [employer] Meetings, dates discussed:    Additional Comments:  03/15/18 J. Nohemi Nicklaus, RN, BSN Pt medically stable for [employer] and insurance auth received for SNF stay.  DC to SNF today, per CSW arrangements.   Quintella BatonJulie W. Jasman Pfeifle, RN, BSN  Trauma/Neuro ICU Case Manager [phone]

## 2018-03-15 NOTE — Discharge Summary (Signed)
Physician Discharge Summary  Patient ID: Austin Leonard MRN: [id number] DOB/AGE: 01/15/1966 52 y.o.  Admit date: 02/27/2018 Discharge date: 03/15/2018  Admission Diagnoses: cerebellar hemorrhage, HTN, orbital fx   Discharge Diagnoses: same   Discharged Condition: stable  Hospital Course: The patient was admitted on 02/27/2018 and taken to the operating room where the patient underwent L suboccipital crani for cerebellar ICH and ventric placement.. The patient tolerated the procedure well and was taken to the recovery room and then to the ICU in critical condition. The hospital course was fairly routine. There were no complications. ENT and ophthal saw the pt. Follow head CTs were good. The wound remained clean dry and intact. Pt had appropriate incisional soreness. No complaints of headache. The patient remained afebrile with stable vital signs, and tolerated a regular diet. The patient continued to increase activities, and pain was well controlled with oral pain medications.   Consults: ENT and ophthalmology  Significant Diagnostic Studies:  Results for orders placed or performed during the hospital encounter of 02/27/18  MRSA PCR Screening  Result Value Ref Range   MRSA by PCR POSITIVE (A) NEGATIVE  Comprehensive metabolic panel  Result Value Ref Range   Sodium 137 135 - 145 mmol/L   Potassium 4.1 3.5 - 5.1 mmol/L   Chloride 101 101 - 111 mmol/L   CO2 19 (L) 22 - 32 mmol/L   Glucose, Bld 131 (H) 65 - 99 mg/dL   BUN 9 6 - 20 mg/dL   Creatinine, Ser 4.09 (H) 0.61 - 1.24 mg/dL   Calcium 9.7 8.9 - 81.1 mg/dL   Total Protein 8.3 (H) 6.5 - 8.1 g/dL   Albumin 4.6 3.5 - 5.0 g/dL   AST 46 (H) 15 - 41 U/L   ALT 26 17 - 63 U/L   Alkaline Phosphatase 88 38 - 126 U/L   Total Bilirubin 0.9 0.3 - 1.2 mg/dL   GFR calc non Af Amer >60 >60 mL/min   GFR calc Af Amer >60 >60 mL/min   Anion gap 17 (H) 5 - 15  CBC  Result Value Ref Range   WBC 17.6 (H) 4.0 - 10.5 K/uL   RBC 5.80 4.22 - 5.81  MIL/uL   Hemoglobin 16.4 13.0 - 17.0 g/dL   HCT 91.4 78.2 - 95.6 %   MCV 85.2 78.0 - 100.0 fL   MCH 28.3 26.0 - 34.0 pg   MCHC 33.2 30.0 - 36.0 g/dL   RDW 21.3 (H) 08.6 - 57.8 %   Platelets 232 150 - 400 K/uL  Ethanol  Result Value Ref Range   Alcohol, Ethyl (B) <10 <10 mg/dL  Urinalysis, Routine w reflex microscopic  Result Value Ref Range   Color, Urine YELLOW YELLOW   APPearance CLEAR CLEAR   Specific Gravity, Urine 1.025 1.005 - 1.030   pH 6.0 5.0 - 8.0   Glucose, UA NEGATIVE NEGATIVE mg/dL   Hgb urine dipstick LARGE (A) NEGATIVE   Bilirubin Urine NEGATIVE NEGATIVE   Ketones, ur NEGATIVE NEGATIVE mg/dL   Protein, ur 30 (A) NEGATIVE mg/dL   Nitrite NEGATIVE NEGATIVE   Leukocytes, UA NEGATIVE NEGATIVE   RBC / HPF TOO NUMEROUS TO COUNT 0 - 5 RBC/hpf   WBC, UA 0-5 0 - 5 WBC/hpf   Bacteria, UA NONE SEEN NONE SEEN   Squamous Epithelial / LPF 0-5 (A) NONE SEEN  Protime-INR  Result Value Ref Range   Prothrombin Time 12.9 11.4 - 15.2 seconds   INR 0.98   Lactic acid, plasma  Result Value Ref Range   Lactic Acid, Venous 3.7 (HH) 0.5 - 1.9 mmol/L  Basic metabolic panel  Result Value Ref Range   Sodium 138 135 - 145 mmol/L   Potassium 4.1 3.5 - 5.1 mmol/L   Chloride 107 101 - 111 mmol/L   CO2 19 (L) 22 - 32 mmol/L   Glucose, Bld 115 (H) 65 - 99 mg/dL   BUN 11 6 - 20 mg/dL   Creatinine, Ser 4.09 (H) 0.61 - 1.24 mg/dL   Calcium 8.7 (L) 8.9 - 10.3 mg/dL   GFR calc non Af Amer 53 (L) >60 mL/min   GFR calc Af Amer >60 >60 mL/min   Anion gap 12 5 - 15  CBC  Result Value Ref Range   WBC 16.7 (H) 4.0 - 10.5 K/uL   RBC 5.07 4.22 - 5.81 MIL/uL   Hemoglobin 14.2 13.0 - 17.0 g/dL   HCT 81.1 91.4 - 78.2 %   MCV 85.2 78.0 - 100.0 fL   MCH 28.0 26.0 - 34.0 pg   MCHC 32.9 30.0 - 36.0 g/dL   RDW 95.6 (H) 21.3 - 08.6 %   Platelets 210 150 - 400 K/uL  Triglycerides  Result Value Ref Range   Triglycerides 180 (H) <150 mg/dL  Magnesium  Result Value Ref Range   Magnesium 2.1 1.7 -  2.4 mg/dL  Phosphorus  Result Value Ref Range   Phosphorus 2.7 2.5 - 4.6 mg/dL  Basic metabolic panel  Result Value Ref Range   Sodium 137 135 - 145 mmol/L   Potassium 4.0 3.5 - 5.1 mmol/L   Chloride 107 101 - 111 mmol/L   CO2 20 (L) 22 - 32 mmol/L   Glucose, Bld 126 (H) 65 - 99 mg/dL   BUN 13 6 - 20 mg/dL   Creatinine, Ser 5.78 0.61 - 1.24 mg/dL   Calcium 8.8 (L) 8.9 - 10.3 mg/dL   GFR calc non Af Amer >60 >60 mL/min   GFR calc Af Amer >60 >60 mL/min   Anion gap 10 5 - 15  CBC  Result Value Ref Range   WBC 17.2 (H) 4.0 - 10.5 K/uL   RBC 5.78 4.22 - 5.81 MIL/uL   Hemoglobin 15.9 13.0 - 17.0 g/dL   HCT 46.9 62.9 - 52.8 %   MCV 85.6 78.0 - 100.0 fL   MCH 27.5 26.0 - 34.0 pg   MCHC 32.1 30.0 - 36.0 g/dL   RDW 41.3 24.4 - 01.0 %   Platelets 223 150 - 400 K/uL  Basic metabolic panel  Result Value Ref Range   Sodium 132 (L) 135 - 145 mmol/L   Potassium 3.3 (L) 3.5 - 5.1 mmol/L   Chloride 94 (L) 101 - 111 mmol/L   CO2 28 22 - 32 mmol/L   Glucose, Bld 96 65 - 99 mg/dL   BUN 16 6 - 20 mg/dL   Creatinine, Ser 2.72 (H) 0.61 - 1.24 mg/dL   Calcium 8.7 (L) 8.9 - 10.3 mg/dL   GFR calc non Af Amer >60 >60 mL/min   GFR calc Af Amer >60 >60 mL/min   Anion gap 10 5 - 15  I-Stat Chem 8, ED  Result Value Ref Range   Sodium 138 135 - 145 mmol/L   Potassium 3.9 3.5 - 5.1 mmol/L   Chloride 103 101 - 111 mmol/L   BUN 11 6 - 20 mg/dL   Creatinine, Ser 5.36 0.61 - 1.24 mg/dL   Glucose, Bld 644 (H) 65 - 99  mg/dL   Calcium, Ion 1.61 (L) 1.15 - 1.40 mmol/L   TCO2 22 22 - 32 mmol/L   Hemoglobin 18.4 (H) 13.0 - 17.0 g/dL   HCT 09.6 (H) 04.5 - 40.9 %  I-Stat CG4 Lactic Acid, ED  Result Value Ref Range   Lactic Acid, Venous 4.87 (HH) 0.5 - 1.9 mmol/L   Comment NOTIFIED PHYSICIAN   I-STAT 3, arterial blood gas (G3+)  Result Value Ref Range   pH, Arterial 7.265 (L) 7.350 - 7.450   pCO2 arterial 39.7 32.0 - 48.0 mmHg   pO2, Arterial 126.0 (H) 83.0 - 108.0 mmHg   Bicarbonate 18.1 (L) 20.0 -  28.0 mmol/L   TCO2 19 (L) 22 - 32 mmol/L   O2 Saturation 98.0 %   Acid-base deficit 9.0 (H) 0.0 - 2.0 mmol/L   Patient temperature 36.6 C    Collection site ARTERIAL LINE    Drawn by RT    Sample type ARTERIAL   I-STAT 7, (LYTES, BLD GAS, ICA, H+H)  Result Value Ref Range   pH, Arterial 7.227 (L) 7.350 - 7.450   pCO2 arterial 56.9 (H) 32.0 - 48.0 mmHg   pO2, Arterial 331.0 (H) 83.0 - 108.0 mmHg   Bicarbonate 24.0 20.0 - 28.0 mmol/L   TCO2 26 22 - 32 mmol/L   O2 Saturation 100.0 %   Acid-base deficit 5.0 (H) 0.0 - 2.0 mmol/L   Sodium 138 135 - 145 mmol/L   Potassium 4.8 3.5 - 5.1 mmol/L   Calcium, Ion 1.15 1.15 - 1.40 mmol/L   HCT 50.0 39.0 - 52.0 %   Hemoglobin 17.0 13.0 - 17.0 g/dL   Patient temperature 81.1 C    Sample type ARTERIAL   Sample to Blood Bank  Result Value Ref Range   Blood Bank Specimen SAMPLE AVAILABLE FOR TESTING    Sample Expiration      02/28/2018 Performed at [facility], [address] [place], [place] [address]     Ct Head Wo Contrast  Result Date: 03/12/2018 CLINICAL DATA:  Unsteady gait for 2 days. Status post suboccipital craniotomy February 27, 2018. EXAM: CT HEAD WITHOUT CONTRAST TECHNIQUE: Contiguous axial images were obtained from the base of the skull through the vertex without intravenous contrast. COMPARISON:  CT HEAD February 27, 2018 and March 08, 2018. FINDINGS: BRAIN: LEFT cerebellar evacuation cavity with small amount of fluid in minimal residual gas. Decreased mass effect. Small volume residual fourth ventricle blood products without hydrocephalus. No overall parenchymal brain volume loss for age. RIGHT frontal lobe intraparenchymal hemorrhage was 7 x 25 mm, now 5 x 18 mm. Minimal blood products in bone fragments along RIGHT frontal catheter tract. Slight ex vacuo dilatation RIGHT lateral ventricle. No acute large vascular territory infarcts. No midline shift, mass effect or interval hemorrhage. Basal cisterns are patent. VASCULAR: Bulbous  appearance of RIGHT MCA bifurcation with punctate calcification. Trace calcific atherosclerosis carotid siphons. SKULL/SOFT TISSUES: No skull fracture. Status post LEFT suboccipital craniectomy, overlying 2.3 x 4.6 cm fluid collection was 1.5 x 4.5 cm. Mild to RIGHT frontal burr holes. Suboccipital soft tissue swelling with skin staples. RIGHT frontal soft tissue swelling with tiny bone fragments and skin staples. ORBITS/SINUSES: Old LEFT inferior orbital blowout fracture with chronic LEFT inferior rectus muscle entrapment.Mild paranasal sinus mucosal thickening without paranasal sinus air-fluid levels. Mastoid air cells are well aerated. OTHER: None. IMPRESSION: 1. Status post LEFT suboccipital craniectomy for cerebellar hematoma, increasing postoperative pseudomeningocele. Decreased LEFT cerebellar mass effect. 2. Small volume residual  fourth ventricle and blood products, status post interval removal of RIGHT EVD. 3. Evolving, contracting small RIGHT frontal lobe hematoma. 4. Proximal RIGHT MCA bifurcation aneurysm. Recommend NONEMERGENT MRA versus CTA. Electronically Signed   By: Awilda Metro M.D.   On: 03/12/2018 22:40   Ct Head Wo Contrast  Result Date: 03/08/2018 CLINICAL DATA:  Follow-up examination status post craniotomy. EXAM: CT HEAD WITHOUT CONTRAST TECHNIQUE: Contiguous axial images were obtained from the base of the skull through the vertex without intravenous contrast. COMPARISON:  Prior CT from 02/28/2018. FINDINGS: Brain: Postoperative changes from prior left occipital craniectomy for evacuation of left cerebellar hematoma again seen. Pneumocephalus has largely resolved in the interim, although a few small foci persist. Persistent small volume scattered blood products along the operative bed within the left cerebellar hemisphere, slightly decreased from prior. Persistent intraventricular hemorrhage with blood in the fourth ventricle, also decreased from previous. No significant blood  products now seen within the lateral or third ventricles. Right frontal approach EVD remains in place with tip at the anterior right lateral ventricle near the caudate head. Ventricular size is decreased from previous with no evidence for hydrocephalus. Evolving right frontal hematoma measures 2.5 x 0.7 cm, overall similar to previous. No new intracranial hemorrhage. No acute large vessel territory infarct. No new extra-axial collection. Remote left thalamic lacunar infarcts noted. Vascular: No asymmetric hyperdense vessel. Skull: Left occipital craniectomy. Fluid collection along the craniectomy site measures 4.5 x 1.5 cm. Overlying skin staples remain in place. Right frontal burr hole with EVD in place. Sinuses/Orbits: Left orbital blowout fracture again noted. Paranasal sinuses and mastoid air cells remain largely clear. Other: None. IMPRESSION: 1. Postoperative changes from prior left occipital craniectomy for evacuation of left cerebellar hematoma, with nearly resolved postoperative pneumocephalus and resolving small volume blood products. 2. Interval decrease in intraventricular hemorrhage. Right frontal EVD remains in place with decreased ventricular size as compared to previous. Small right frontal hemorrhage associated with the EVD relatively similar. 3. No other new intracranial abnormality. Electronically Signed   By: Rise Mu M.D.   On: 03/08/2018 06:10   Ct Head Wo Contrast  Result Date: 02/28/2018 CLINICAL DATA:  Follow-up evacuation of cerebellar hemorrhage. EXAM: CT HEAD WITHOUT CONTRAST TECHNIQUE: Contiguous axial images were obtained from the base of the skull through the vertex without intravenous contrast. COMPARISON:  02/27/2018 FINDINGS: Brain: Interval left occipital craniectomy and evacuation of a large left cerebellar hematoma. Hemostasis material along the operative approach. Blood continues to fill the fourth ventricle. Small amount in the third ventricle and lateral  ventricles appears similar. Ventriculostomy catheter in the frontal horn of the right lateral ventricle. Small in frank wall hemorrhage in the right frontal cortical and subcortical brain measuring about 1 x 2 cm in size. No mass effect associated with that. Ventricular size is smaller than on the presentation scan. Vascular: No significant vascular finding. Skull: Otherwise negative Sinuses/Orbits: Clear except for opacification of the left maxillary sinus related to the large blowout fracture. Other: None IMPRESSION: Evacuation of left cerebellar hematoma. No additional posterior fossa bleeding. Intraventricular blood appears about the same. Ventriculostomy tube well positioned with reduction in ventricular size. 1 x 2 cm hemorrhage in the right frontal lobe associated with the ventriculostomy placement. Electronically Signed   By: Paulina Fusi M.D.   On: 02/28/2018 07:29   Ct Head Wo Contrast  Result Date: 02/27/2018 CLINICAL DATA:  Erratically behavior.  Head trauma. EXAM: CT HEAD WITHOUT CONTRAST CT MAXILLOFACIAL WITHOUT CONTRAST CT CERVICAL SPINE WITHOUT  CONTRAST TECHNIQUE: Multidetector CT imaging of the head, cervical spine, and maxillofacial structures were performed using the standard protocol without intravenous contrast. Multiplanar CT image reconstructions of the cervical spine and maxillofacial structures were also generated. COMPARISON:  None. FINDINGS: CT HEAD FINDINGS Brain: Acute intraparenchymal hemorrhage in the left cerebellum. Hematoma measures 4.5 x 3.7 x 2.6 cm. (volume = 23). Intraventricular penetration with filling of the fourth ventricle and some reflux into the third and lateral ventricles. Early ventricular fullness/hydrocephalus. This hemorrhages probably a hypertensive hemorrhage. The cerebral hemispheres and cells appear negative. Vascular: No abnormal vascular finding. Skull: Negative Other: Facial fracture as below. CT MAXILLOFACIAL FINDINGS Osseous: Orbital floor blowout  fracture on the left containing fat. Some potential for trapping of the inferior rectus muscle. Fragments are displaced into the maxillary sinus along with a good bit of orbital fat. Fracture of the medial wall the maxillary sinus, bulging into the lateral left nasal passages. Orbits: No evidence of globe disruption. Pre and postseptal hemorrhage. No evidence of confluent postseptal hematoma. Sinuses: Clear other than the traumatic findings of the left maxillary sinus. Soft tissues: Otherwise negative. CT CERVICAL SPINE FINDINGS Alignment: Motion degraded study.  Alignment appears normal. Skull base and vertebrae: No evidence of fracture.  Motion degraded. Soft tissues and spinal canal: Negative Disc levels:  No significant degenerative changes. Upper chest: Not included. Other: None IMPRESSION: Acute intraparenchymal hemorrhage in the left cerebellar hemisphere with hematoma volume 23 cc. Intraventricular penetration with developing hydrocephalus.These results were called by telephone at the time of interpretation on 02/27/2018 at 7:12 pm to Dr. Melene Plan , who verbally acknowledged these results. Large orbital floor blowout fracture on the left with displaced fragments into the maxillary sinus. Large amount of herniated orbital fat, with considerable potential for disruption of the biomechanics of the extraocular musculature. This may require surgical reduction. Fracture of the medial wall of the maxillary sinus bulging into the lateral left nasal passages. Motion degraded cervical CT not showing any abnormality. Electronically Signed   By: Paulina Fusi M.D.   On: 02/27/2018 19:20   Ct Chest W Contrast  Result Date: 02/27/2018 CLINICAL DATA:  Patient was having nausea, vomiting and diarrhea this morning and was found by wife in the bathroom having his head on bathtub. Hypertension. Blunt abdominal trauma. EXAM: CT CHEST, ABDOMEN, AND PELVIS WITH CONTRAST TECHNIQUE: Multidetector CT imaging of the chest, abdomen  and pelvis was performed following the standard protocol during bolus administration of intravenous contrast. CONTRAST:  ISOVUE-300 IOPAMIDOL (ISOVUE-300) INJECTION 61% COMPARISON:  CXR 12/02/2005 FINDINGS: CT CHEST FINDINGS Cardiovascular: No mediastinal hematoma. Normal size heart with coronary arteriosclerosis. Minimal aortic atherosclerosis without aneurysm or dissection. No large central pulmonary embolus. Mediastinum/Nodes: No mediastinal adenopathy. No dominant mass of the included thyroid nor thyromegaly. Patent midline trachea and mainstem bronchi. Unremarkable CT appearance of the esophagus. Lungs/Pleura: No pneumothorax or effusion. No pulmonary consolidation. The respiratory motion artifacts at the lung bases limit assessment. Musculoskeletal: No acute fracture. CT ABDOMEN PELVIS FINDINGS Hepatobiliary: No focal liver abnormality is seen. No gallstones, gallbladder wall thickening, or biliary dilatation. Pancreas: Unremarkable. No pancreatic ductal dilatation or surrounding inflammatory changes. Spleen: No splenic injury or perisplenic hematoma. Adrenals/Urinary Tract: Adrenal glands are unremarkable. A 7.1 cm simple cyst is noted off the interpolar left kidney. The right kidney is unremarkable. No nephrolithiasis. No hydroureteronephrosis. Decompressed urinary bladder without focal mural thickening or calculus. Stomach/Bowel: Small hiatal hernia. Nondistended stomach with normal small bowel rotation. No bowel obstruction or inflammation. Normal appendix. Submucosal  fat deposition compatible with chronic inflammatory bowel is identified of the colon and rectum with diffuse colonic spasm currently from cecum through distal descending colon. Vascular/Lymphatic: Mild aortoiliac atherosclerosis without adenopathy. Reproductive: Normal size prostate and seminal vesicles. Other: No free air nor free fluid. Musculoskeletal: No acute or significant osseous findings. Left acetabular bone island. IMPRESSION:  1. Coronary arteriosclerosis.  No active pulmonary disease. 2. No acute solid nor hollow visceral organ abnormality. 3. Submucosal fat deposition within the colon from cecum through descending colon as well as rectum compatible with changes of chronic inflammatory bowel. No active inflammatory process. Diffuse colonic spasm is seen currently. Electronically Signed   By: Tollie Eth M.D.   On: 02/27/2018 19:42   Ct Cervical Spine Wo Contrast  Result Date: 02/27/2018 CLINICAL DATA:  Erratically behavior.  Head trauma. EXAM: CT HEAD WITHOUT CONTRAST CT MAXILLOFACIAL WITHOUT CONTRAST CT CERVICAL SPINE WITHOUT CONTRAST TECHNIQUE: Multidetector CT imaging of the head, cervical spine, and maxillofacial structures were performed using the standard protocol without intravenous contrast. Multiplanar CT image reconstructions of the cervical spine and maxillofacial structures were also generated. COMPARISON:  None. FINDINGS: CT HEAD FINDINGS Brain: Acute intraparenchymal hemorrhage in the left cerebellum. Hematoma measures 4.5 x 3.7 x 2.6 cm. (volume = 23). Intraventricular penetration with filling of the fourth ventricle and some reflux into the third and lateral ventricles. Early ventricular fullness/hydrocephalus. This hemorrhages probably a hypertensive hemorrhage. The cerebral hemispheres and cells appear negative. Vascular: No abnormal vascular finding. Skull: Negative Other: Facial fracture as below. CT MAXILLOFACIAL FINDINGS Osseous: Orbital floor blowout fracture on the left containing fat. Some potential for trapping of the inferior rectus muscle. Fragments are displaced into the maxillary sinus along with a good bit of orbital fat. Fracture of the medial wall the maxillary sinus, bulging into the lateral left nasal passages. Orbits: No evidence of globe disruption. Pre and postseptal hemorrhage. No evidence of confluent postseptal hematoma. Sinuses: Clear other than the traumatic findings of the left maxillary  sinus. Soft tissues: Otherwise negative. CT CERVICAL SPINE FINDINGS Alignment: Motion degraded study.  Alignment appears normal. Skull base and vertebrae: No evidence of fracture.  Motion degraded. Soft tissues and spinal canal: Negative Disc levels:  No significant degenerative changes. Upper chest: Not included. Other: None IMPRESSION: Acute intraparenchymal hemorrhage in the left cerebellar hemisphere with hematoma volume 23 cc. Intraventricular penetration with developing hydrocephalus.These results were called by telephone at the time of interpretation on 02/27/2018 at 7:12 pm to Dr. Melene Plan , who verbally acknowledged these results. Large orbital floor blowout fracture on the left with displaced fragments into the maxillary sinus. Large amount of herniated orbital fat, with considerable potential for disruption of the biomechanics of the extraocular musculature. This may require surgical reduction. Fracture of the medial wall of the maxillary sinus bulging into the lateral left nasal passages. Motion degraded cervical CT not showing any abnormality. Electronically Signed   By: Paulina Fusi M.D.   On: 02/27/2018 19:20   Ct Abdomen Pelvis W Contrast  Result Date: 02/27/2018 CLINICAL DATA:  Patient was having nausea, vomiting and diarrhea this morning and was found by wife in the bathroom having his head on bathtub. Hypertension. Blunt abdominal trauma. EXAM: CT CHEST, ABDOMEN, AND PELVIS WITH CONTRAST TECHNIQUE: Multidetector CT imaging of the chest, abdomen and pelvis was performed following the standard protocol during bolus administration of intravenous contrast. CONTRAST:  ISOVUE-300 IOPAMIDOL (ISOVUE-300) INJECTION 61% COMPARISON:  CXR 12/02/2005 FINDINGS: CT CHEST FINDINGS Cardiovascular: No mediastinal hematoma. Normal size  heart with coronary arteriosclerosis. Minimal aortic atherosclerosis without aneurysm or dissection. No large central pulmonary embolus. Mediastinum/Nodes: No mediastinal  adenopathy. No dominant mass of the included thyroid nor thyromegaly. Patent midline trachea and mainstem bronchi. Unremarkable CT appearance of the esophagus. Lungs/Pleura: No pneumothorax or effusion. No pulmonary consolidation. The respiratory motion artifacts at the lung bases limit assessment. Musculoskeletal: No acute fracture. CT ABDOMEN PELVIS FINDINGS Hepatobiliary: No focal liver abnormality is seen. No gallstones, gallbladder wall thickening, or biliary dilatation. Pancreas: Unremarkable. No pancreatic ductal dilatation or surrounding inflammatory changes. Spleen: No splenic injury or perisplenic hematoma. Adrenals/Urinary Tract: Adrenal glands are unremarkable. A 7.1 cm simple cyst is noted off the interpolar left kidney. The right kidney is unremarkable. No nephrolithiasis. No hydroureteronephrosis. Decompressed urinary bladder without focal mural thickening or calculus. Stomach/Bowel: Small hiatal hernia. Nondistended stomach with normal small bowel rotation. No bowel obstruction or inflammation. Normal appendix. Submucosal fat deposition compatible with chronic inflammatory bowel is identified of the colon and rectum with diffuse colonic spasm currently from cecum through distal descending colon. Vascular/Lymphatic: Mild aortoiliac atherosclerosis without adenopathy. Reproductive: Normal size prostate and seminal vesicles. Other: No free air nor free fluid. Musculoskeletal: No acute or significant osseous findings. Left acetabular bone island. IMPRESSION: 1. Coronary arteriosclerosis.  No active pulmonary disease. 2. No acute solid nor hollow visceral organ abnormality. 3. Submucosal fat deposition within the colon from cecum through descending colon as well as rectum compatible with changes of chronic inflammatory bowel. No active inflammatory process. Diffuse colonic spasm is seen currently. Electronically Signed   By: Tollie Eth M.D.   On: 02/27/2018 19:42   Portable Chest Xray  Result Date:  03/01/2018 CLINICAL DATA:  52 year old male status post trauma with intracranial hemorrhage, Neuro surgical evacuation. EXAM: PORTABLE CHEST 1 VIEW COMPARISON:  Fourteen 21 and earlier. FINDINGS: Portable AP semi upright view at 0504 hours. Extubated and enteric tube removed. Stable left subclavian approach central line. Mildly larger lung volumes. Normal cardiac size and mediastinal contours. Mild bilateral basilar predominant pulmonary interstitial opacity, but otherwise when allowing for portable technique the lungs are clear. No pneumothorax or pleural effusion. IMPRESSION: 1. Extubated and enteric tube removed. 2. Mild nonspecific basilar predominant increased interstitial opacity in both lungs, perhaps vascular congestion without edema. Viral or atypical respiratory infection felt less likely. 3. No other acute cardiopulmonary abnormality. Electronically Signed   By: Odessa Fleming M.D.   On: 03/01/2018 07:59   Dg Chest Port 1 View  Result Date: 02/28/2018 CLINICAL DATA:  Intubation and orogastric tube placement. EXAM: PORTABLE CHEST 1 VIEW COMPARISON:  Chest CT earlier this day. FINDINGS: Endotracheal tube 2.5 cm from the carina. Enteric tube in place with tip below the diaphragm. Side-port is not confidently visualized. Left subclavian central venous catheter tip in the midline in the region of the brachiocephalic vein. Borderline cardiomegaly with normal mediastinal contours. Mild right basilar atelectasis. No pulmonary edema or pleural effusion. No pneumothorax. IMPRESSION: 1. Endotracheal tube 2.5 cm from the carina. Enteric tube in place with tip below the diaphragm. 2. Left subclavian central line tip in the region of the distal brachiocephalic vein. No pneumothorax. 3. Right basilar atelectasis. Electronically Signed   By: Rubye Oaks M.D.   On: 02/28/2018 00:33   Ct Maxillofacial Wo Contrast  Result Date: 03/07/2018 CLINICAL DATA:  Follow-up exam for orbital floor fracture. EXAM: CT  MAXILLOFACIAL WITHOUT CONTRAST TECHNIQUE: Multidetector CT imaging of the maxillofacial structures was performed. Multiplanar CT image reconstructions were also generated. COMPARISON:  Prior CT  from 02/27/2018. FINDINGS: Osseous/Orbits: Acute left orbital floor blowout fracture again seen. Displaced fragments protrude inferiorly into the left maxillary sinus, just medial to the left infraorbital foramen. Fairly sizable portion of intraorbital fat protrudes through the fracture defect, extending approximately 17 mm inferior to the left orbital floor. Left inferior rectus muscle slightly protrudes through the fracture defect, similar to previous. Remainder of the bony left orbit intact. Left globe remains intact. Resolving pre and postseptal swelling and hemorrhage. No confluent intraorbital/postseptal hematoma identified. No acute abnormality about the right globe or orbit. Remainder of the bones of the face intact. Scattered periapical lucencies noted about the dentition. Sinuses: Mild scattered ethmoidal sinus mucosal thickening. Paranasal sinuses are otherwise largely clear. Previously seen left maxillary hemosinus has resolved. Mastoid air cells and middle ear cavities are well pneumatized and clear. Soft tissues: Resolving left periorbital/preseptal contusion. No new soft tissue abnormality about the face. Limited intracranial: Postoperative changes from prior left cerebellar hematoma evacuation partially visualized. Residual intracranial hemorrhage noted. Right frontal approach EVD remains in place. IMPRESSION: 1. Left orbital floor blow-out fracture as detailed above, similar in appearance and position relative to prior CT from 02/27/2018. 2. Resolving pre and postseptal swelling/hemorrhage. Electronically Signed   By: Rise Mu M.D.   On: 03/07/2018 02:49   Ct Maxillofacial Wo Contrast  Result Date: 02/27/2018 CLINICAL DATA:  Erratically behavior.  Head trauma. EXAM: CT HEAD WITHOUT CONTRAST CT  MAXILLOFACIAL WITHOUT CONTRAST CT CERVICAL SPINE WITHOUT CONTRAST TECHNIQUE: Multidetector CT imaging of the head, cervical spine, and maxillofacial structures were performed using the standard protocol without intravenous contrast. Multiplanar CT image reconstructions of the cervical spine and maxillofacial structures were also generated. COMPARISON:  None. FINDINGS: CT HEAD FINDINGS Brain: Acute intraparenchymal hemorrhage in the left cerebellum. Hematoma measures 4.5 x 3.7 x 2.6 cm. (volume = 23). Intraventricular penetration with filling of the fourth ventricle and some reflux into the third and lateral ventricles. Early ventricular fullness/hydrocephalus. This hemorrhages probably a hypertensive hemorrhage. The cerebral hemispheres and cells appear negative. Vascular: No abnormal vascular finding. Skull: Negative Other: Facial fracture as below. CT MAXILLOFACIAL FINDINGS Osseous: Orbital floor blowout fracture on the left containing fat. Some potential for trapping of the inferior rectus muscle. Fragments are displaced into the maxillary sinus along with a good bit of orbital fat. Fracture of the medial wall the maxillary sinus, bulging into the lateral left nasal passages. Orbits: No evidence of globe disruption. Pre and postseptal hemorrhage. No evidence of confluent postseptal hematoma. Sinuses: Clear other than the traumatic findings of the left maxillary sinus. Soft tissues: Otherwise negative. CT CERVICAL SPINE FINDINGS Alignment: Motion degraded study.  Alignment appears normal. Skull base and vertebrae: No evidence of fracture.  Motion degraded. Soft tissues and spinal canal: Negative Disc levels:  No significant degenerative changes. Upper chest: Not included. Other: None IMPRESSION: Acute intraparenchymal hemorrhage in the left cerebellar hemisphere with hematoma volume 23 cc. Intraventricular penetration with developing hydrocephalus.These results were called by telephone at the time of  interpretation on 02/27/2018 at 7:12 pm to Dr. Melene Plan , who verbally acknowledged these results. Large orbital floor blowout fracture on the left with displaced fragments into the maxillary sinus. Large amount of herniated orbital fat, with considerable potential for disruption of the biomechanics of the extraocular musculature. This may require surgical reduction. Fracture of the medial wall of the maxillary sinus bulging into the lateral left nasal passages. Motion degraded cervical CT not showing any abnormality. Electronically Signed   By: Scherrie Bateman.D.  On: 02/27/2018 19:20    Antibiotics:  Anti-infectives (From admission, onward)   Start     Dose/Rate Route Frequency Ordered Stop   02/28/18 0200  ceFAZolin (ANCEF) IVPB 1 g/50 mL premix     1 g 100 mL/hr over 30 Minutes Intravenous Every 8 hours 02/27/18 2326 02/28/18 1030   02/27/18 2132  bacitracin 50,000 Units in sodium chloride 0.9 % 500 mL irrigation  Status:  Discontinued       As needed 02/27/18 2132 02/27/18 2317      Discharge Exam: Blood pressure (!) 136/95, pulse 85, temperature 98.2 F (36.8 C), temperature source Oral, resp. rate 14, height 5\' 6"  (1.676 m), weight 104 kg (229 lb 4.5 oz), SpO2 99 %. Neurologic: Grossly normal except for ataxia Incision CDI  Discharge Medications:   Allergies as of 03/15/2018   No Known Allergies     Medication List    STOP taking these medications   aspirin 325 MG tablet     TAKE these medications   acetaminophen 500 MG tablet Commonly known as:  TYLENOL Take 1,000 mg by mouth every 8 (eight) hours as needed (pain).   hydrochlorothiazide 25 MG tablet Commonly known as:  HYDRODIURIL Take 1 tablet (25 mg total) by mouth daily.   ibuprofen 200 MG tablet Commonly known as:  ADVIL,MOTRIN Take 600 mg by mouth every 6 (six) hours as needed (pain).   indapamide 1.25 MG tablet Commonly known as:  LOZOL Take 1.25 mg by mouth daily.   nebivolol 10 MG tablet Commonly known  as:  BYSTOLIC Take 1 tablet (10 mg total) by mouth daily. Start taking on:  03/16/2018 What changed:    medication strength  how much to take   rosuvastatin 10 MG tablet Commonly known as:  CRESTOR Take 10 mg by mouth daily.       Disposition: SNF   Final Dx: cerebellar ICH, HTN, L orbital fx  Discharge Instructions    Ambulatory referral to Occupational Therapy   Complete by:  As directed    Ambulatory referral to Physical Therapy   Complete by:  As directed    Call MD for:  difficulty breathing, headache or visual disturbances   Complete by:  As directed    Call MD for:  persistant nausea and vomiting   Complete by:  As directed    Call MD for:  redness, tenderness, or signs of infection (pain, swelling, redness, odor or green/yellow discharge around incision site)   Complete by:  As directed    Call MD for:  severe uncontrolled pain   Complete by:  As directed    Call MD for:  temperature >100.4   Complete by:  As directed    Diet - low sodium heart healthy   Complete by:  As directed    Increase activity slowly   Complete by:  As directed        Contact information for follow-up providers    Tia AlertJones, Burnice Vassel S, MD. Schedule an appointment as soon as possible for a visit in 1 week(s).   Specialty:  Neurosurgery Contact information: [address] [place] [address] [phone]        Floydene FlockAbugo, Usiwoma Ene, MD. Schedule an appointment as soon as possible for a visit in 1 week(s).   Specialty:  Oculoplastics Ophthalmology Contact information: [address] [place] [address] [phone]            Contact information for after-discharge care  Destination    [facility] .   Service:  Skilled Nursing Contact information: [address] [place] [address] [phone]                   Signed: Tia Alert 03/15/2018, 1:27 PM

## 2018-03-15 NOTE — Social Work (Signed)
Clinical Social Worker facilitated patient discharge including contacting patient family and facility to confirm patient discharge plans.  Clinical information faxed to facility and family agreeable with plan.  [facility].  RN to call [phone] with report prior to discharge.  Clinical Social Worker will sign off for now as social work intervention is no longer needed. Please consult us again if new need arises.  Doy HutchingIsabel H Wojciech Willetts, LCSWA Clinical Social Worker

## 2018-03-20 DIAGNOSIS — S0282XA Fracture of other specified skull and facial bones, left side, initial encounter for closed fracture: Secondary | ICD-10-CM | POA: Diagnosis not present

## 2018-03-20 DIAGNOSIS — I619 Nontraumatic intracerebral hemorrhage, unspecified: Secondary | ICD-10-CM | POA: Diagnosis not present

## 2018-04-01 DIAGNOSIS — I509 Heart failure, unspecified: Secondary | ICD-10-CM | POA: Diagnosis not present

## 2018-04-01 DIAGNOSIS — I6930 Unspecified sequelae of cerebral infarction: Secondary | ICD-10-CM | POA: Diagnosis not present

## 2018-04-01 DIAGNOSIS — R260 Ataxic gait: Secondary | ICD-10-CM | POA: Diagnosis not present

## 2018-04-01 DIAGNOSIS — M6281 Muscle weakness (generalized): Secondary | ICD-10-CM | POA: Diagnosis not present

## 2018-04-04 DIAGNOSIS — M6281 Muscle weakness (generalized): Secondary | ICD-10-CM | POA: Diagnosis not present

## 2018-04-04 DIAGNOSIS — S0282XA Fracture of other specified skull and facial bones, left side, initial encounter for closed fracture: Secondary | ICD-10-CM | POA: Diagnosis not present

## 2018-04-05 ENCOUNTER — Other Ambulatory Visit (HOSPITAL_COMMUNITY): Payer: Self-pay | Admitting: Neurological Surgery

## 2018-04-05 DIAGNOSIS — I614 Nontraumatic intracerebral hemorrhage in cerebellum: Secondary | ICD-10-CM

## 2018-04-08 ENCOUNTER — Ambulatory Visit (HOSPITAL_COMMUNITY)
Admission: RE | Admit: 2018-04-08 | Discharge: 2018-04-08 | Disposition: A | Discharge status: Home / Self Care | Payer: BLUE CROSS/BLUE SHIELD | Source: Ambulatory Visit | Attending: Neurological Surgery | Admitting: Neurological Surgery

## 2018-04-08 DIAGNOSIS — G9619 Other disorders of meninges, not elsewhere classified: Secondary | ICD-10-CM | POA: Insufficient documentation

## 2018-04-08 DIAGNOSIS — Z8673 Personal history of transient ischemic attack (TIA), and cerebral infarction without residual deficits: Secondary | ICD-10-CM | POA: Insufficient documentation

## 2018-04-08 DIAGNOSIS — I614 Nontraumatic intracerebral hemorrhage in cerebellum: Secondary | ICD-10-CM | POA: Diagnosis not present

## 2018-04-08 DIAGNOSIS — S0232XA Fracture of orbital floor, left side, initial encounter for closed fracture: Secondary | ICD-10-CM | POA: Diagnosis not present

## 2018-04-08 DIAGNOSIS — H05409 Unspecified enophthalmos, unspecified eye: Secondary | ICD-10-CM | POA: Insufficient documentation

## 2018-04-08 DIAGNOSIS — X58XXXA Exposure to other specified factors, initial encounter: Secondary | ICD-10-CM | POA: Insufficient documentation

## 2018-05-14 DIAGNOSIS — S0282XA Fracture of other specified skull and facial bones, left side, initial encounter for closed fracture: Secondary | ICD-10-CM | POA: Diagnosis not present

## 2018-07-20 IMAGING — CT CT HEAD W/O CM
3 series · 14 of 47 positions shown, 16 images · non-contrast
Comparison: 03/12/2018

CLINICAL DATA: Left-sided nontraumatic intracerebral hemorrhage of
the cerebellum. Followup

EXAM:
CT HEAD WITHOUT CONTRAST
TECHNIQUE: Contiguous axial images were obtained from the base of the skull
through the vertex without intravenous contrast.

[Series 3: head 5.0 h30s · axial · 0.45mm/px · z∈[-137,-7]mm · 8 of 32 slices shown, 10 images]
[im 3/32  brain]
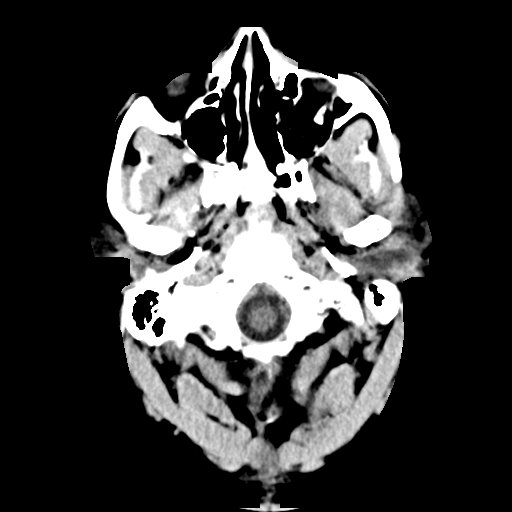
[im 3/32  bone]
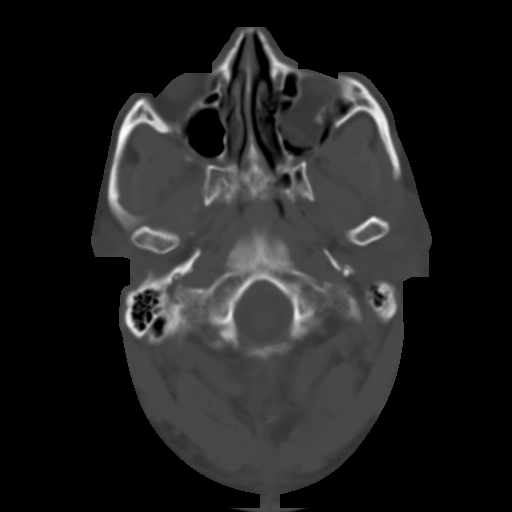
[im 7/32  brain]
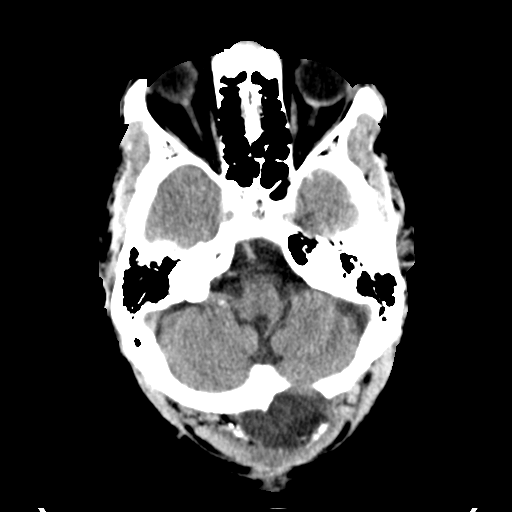
[im 10/32  brain]
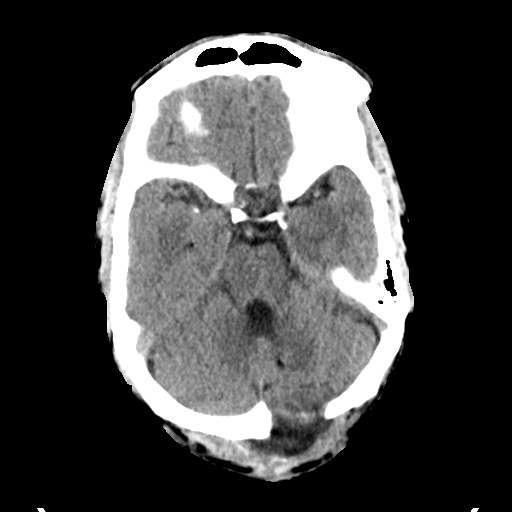
[im 14/32  brain]
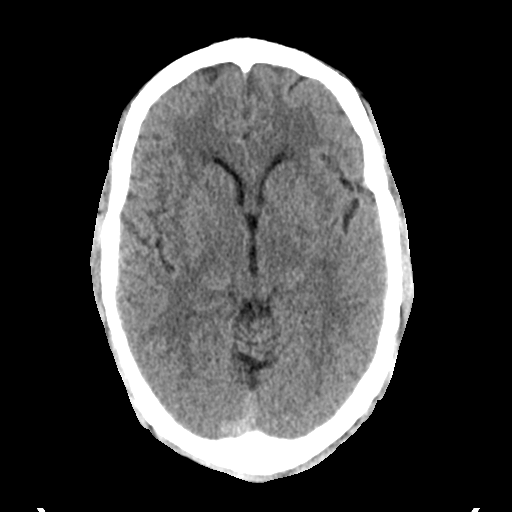
[im 18/32  brain]
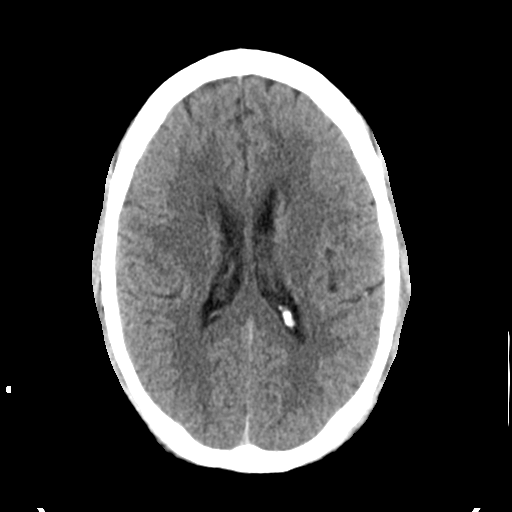
[im 18/32  bone]
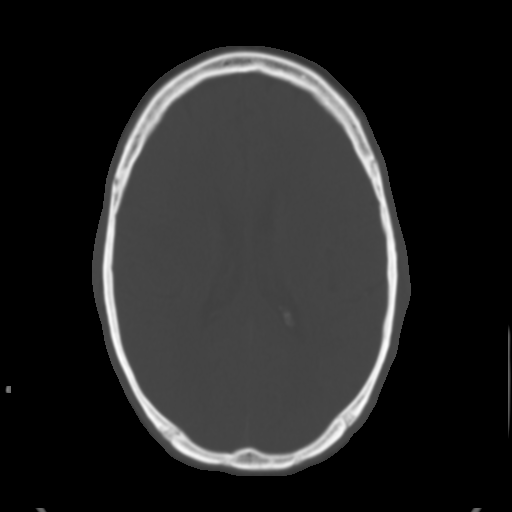
[im 22/32  brain]
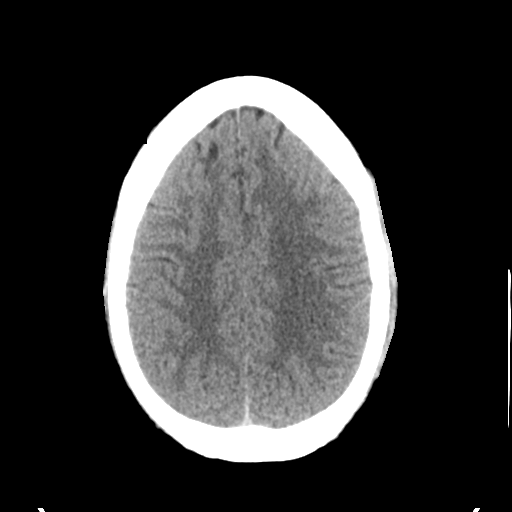
[im 25/32  brain]
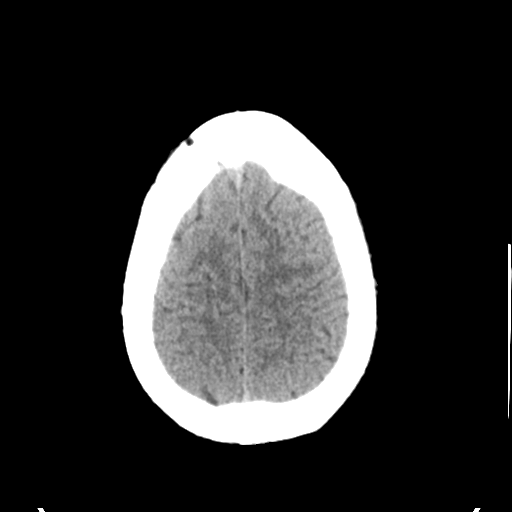
[im 29/32  brain]
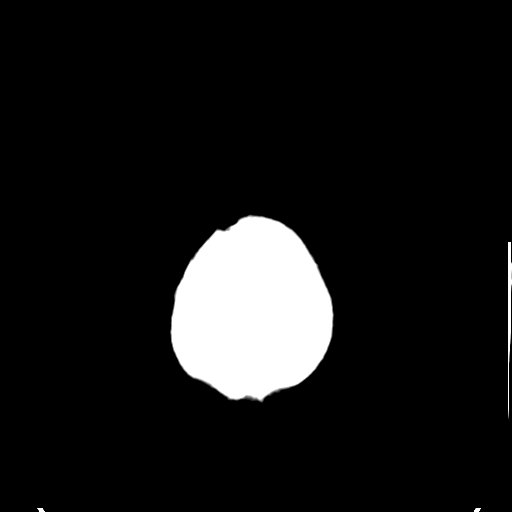

[Series 5: head 3.0 mpr cor · coronal · 0.31mm/px · 3 of 73 slices shown]
[im 25/73  brain]
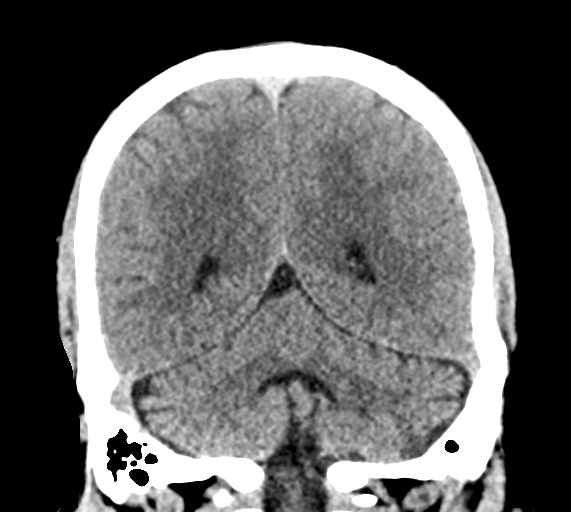
[im 33/73  brain]
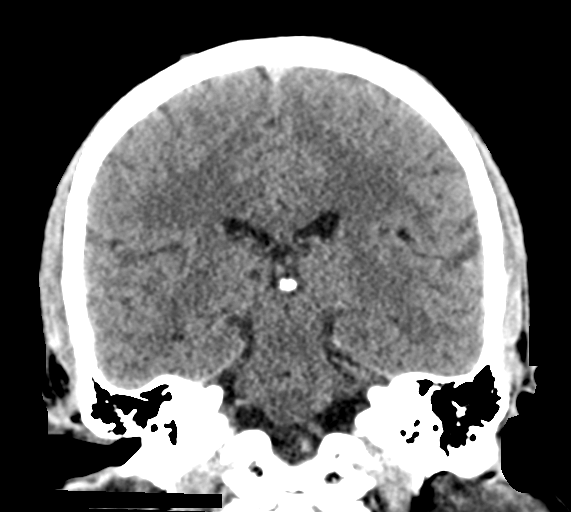
[im 41/73  brain]
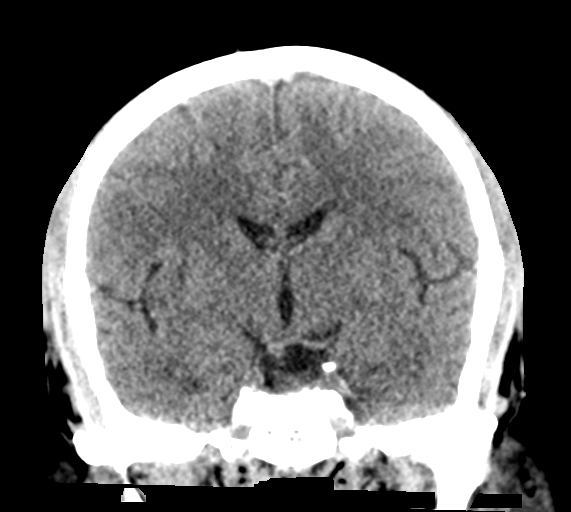

[Series 6: head 3.0 mpr sag · sagittal · 0.31mm/px · 3 of 58 slices shown]
[im 20/58  brain]
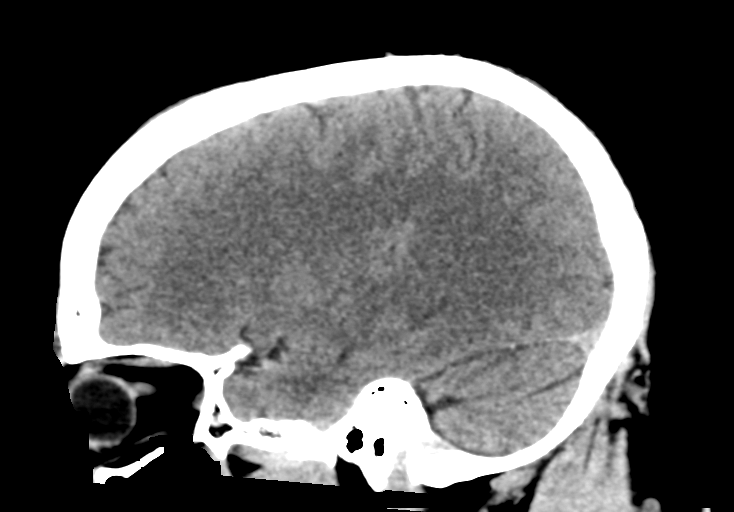
[im 29/58  brain]
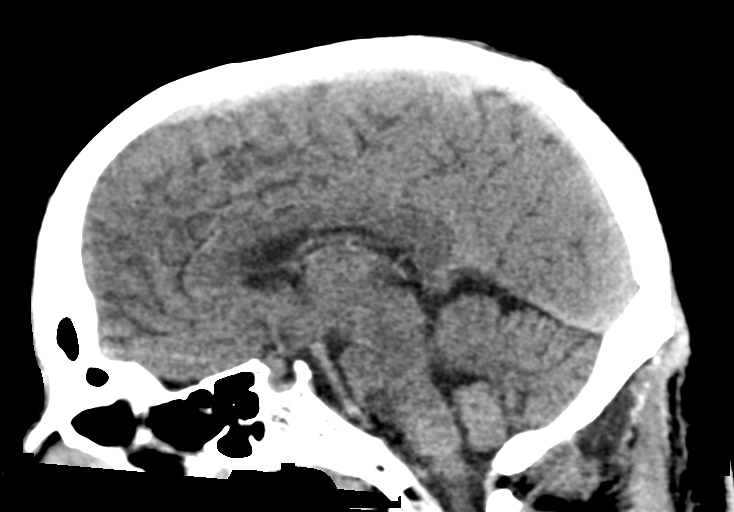
[im 39/58  brain]
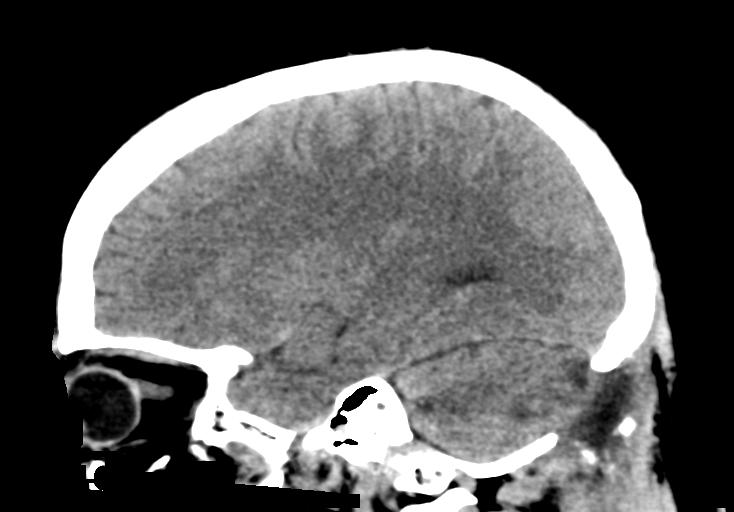

[14 of 47 positions shown; findings below may reference images not displayed]

FINDINGS: Brain: Decreased edema and resolved mass effect at the left
cerebellar hematoma site. Pseudomeningocele at the craniectomy is
stable to mildly decreased in size, 4.3 x 2.2 cm on axial slices.
The fourth ventricle is widely patent. No hydrocephalus.
Intraventricular and right frontal EVD site hemorrhage has resolved
since prior. Low-density in the cerebral white matter attributed to
chronic small vessel disease. Remote left thalamic infarct.

Vascular: Atherosclerotic calcification. Right MCA suspected on
prior is not clearly visualized today, but this does not exclude a
aneurysm in this location.

Skull: Left cerebellar craniectomy as described above.

Sinuses/Orbits: Left orbital floor blow-out fracture with orbital
fat herniation and inferior rectus rounding, stable. Mild
enophthalmos on the left. Mild left orbital fat stranding is stable.
IMPRESSION: 1. Resolved intracranial hemorrhage. Decreased edema and mass effect
at the left cerebellar hematoma site.
2. Prior EVD.  No hydrocephalus.
3. Stable to slightly decreased pseudomeningocele along the
craniectomy.
4. Chronic small vessel ischemia including remote left thalamic
infarct.
5. Known left orbital blowout fracture with orbital fat herniation,
inferior rectus rounding, and mild enophthalmos. Mild left orbital
fat stranding is stable.

## 2018-08-09 ENCOUNTER — Ambulatory Visit: Payer: BLUE CROSS/BLUE SHIELD | Attending: Family Medicine | Admitting: Family Medicine

## 2018-08-09 ENCOUNTER — Encounter: Payer: Self-pay | Admitting: Family Medicine

## 2018-08-09 VITALS — BP 144/81 | HR 74 | Temp 98.6°F | Resp 16 | Ht 65.2 in | Wt 220.2 lb

## 2018-08-09 DIAGNOSIS — M7989 Other specified soft tissue disorders: Secondary | ICD-10-CM | POA: Diagnosis not present

## 2018-08-09 DIAGNOSIS — Z79899 Other long term (current) drug therapy: Secondary | ICD-10-CM

## 2018-08-09 DIAGNOSIS — F17210 Nicotine dependence, cigarettes, uncomplicated: Secondary | ICD-10-CM | POA: Insufficient documentation

## 2018-08-09 DIAGNOSIS — I10 Essential (primary) hypertension: Secondary | ICD-10-CM | POA: Diagnosis not present

## 2018-08-09 DIAGNOSIS — Z823 Family history of stroke: Secondary | ICD-10-CM | POA: Insufficient documentation

## 2018-08-09 DIAGNOSIS — Z833 Family history of diabetes mellitus: Secondary | ICD-10-CM | POA: Insufficient documentation

## 2018-08-09 DIAGNOSIS — E669 Obesity, unspecified: Secondary | ICD-10-CM | POA: Diagnosis not present

## 2018-08-09 DIAGNOSIS — Z8679 Personal history of other diseases of the circulatory system: Secondary | ICD-10-CM | POA: Insufficient documentation

## 2018-08-09 DIAGNOSIS — Z8781 Personal history of (healed) traumatic fracture: Secondary | ICD-10-CM

## 2018-08-09 DIAGNOSIS — Z8249 Family history of ischemic heart disease and other diseases of the circulatory system: Secondary | ICD-10-CM | POA: Diagnosis not present

## 2018-08-09 DIAGNOSIS — Z8673 Personal history of transient ischemic attack (TIA), and cerebral infarction without residual deficits: Secondary | ICD-10-CM | POA: Diagnosis not present

## 2018-08-09 DIAGNOSIS — Z9889 Other specified postprocedural states: Secondary | ICD-10-CM

## 2018-08-09 DIAGNOSIS — R42 Dizziness and giddiness: Secondary | ICD-10-CM | POA: Diagnosis not present

## 2018-08-09 DIAGNOSIS — E785 Hyperlipidemia, unspecified: Secondary | ICD-10-CM

## 2018-08-09 DIAGNOSIS — Z6835 Body mass index (BMI) 35.0-35.9, adult: Secondary | ICD-10-CM | POA: Insufficient documentation

## 2018-08-09 LAB — POCT GLYCOSYLATED HEMOGLOBIN (HGB A1C): Hemoglobin A1C: 5.4 % (ref 4.0–5.6)

## 2018-08-09 MED ORDER — HYDROCHLOROTHIAZIDE 25 MG PO TABS: 25.0000 mg | ORAL_TABLET | Freq: Every day | ORAL | 6 refills | Status: DC

## 2018-08-09 MED ORDER — HYDROCHLOROTHIAZIDE 25 MG PO TABS: 25.0000 mg | ORAL_TABLET | Freq: Every day | ORAL | 2 refills | Status: DC

## 2018-08-09 MED ORDER — ROSUVASTATIN CALCIUM 10 MG PO TABS: 10.0000 mg | ORAL_TABLET | Freq: Every day | ORAL | 6 refills | Status: DC

## 2018-08-09 MED ORDER — NEBIVOLOL HCL 10 MG PO TABS: 10.0000 mg | ORAL_TABLET | Freq: Every day | ORAL | 1 refills | Status: DC

## 2018-08-09 NOTE — Progress Notes (Signed)
Subjective:    Patient ID: Austin Leonard, male    DOB: May 22, 1966, 52 y.o.   MRN: [id number]  HPI 52 year old male new to the practice who is status post hospitalization in April of this year due to a cerebellar hemorrhage, hypertension and left orbital fracture. Patient states that he does not recall much about the day of his hospital admission.  Patient states that before his stroke, he believed that he was in good health but apparently had high blood pressure.  Patient states that he was told by the doctors at the hospital that his high blood pressure caused him to have a stroke from bleeding on his brain.  Patient also must have fallen at home at some point suffering a fracture to the orbit of his left eye.  Patient denies any visual disturbance as a result of his stroke or orbital fracture but patient often has a sensation that there is something in his eye.  Patient states that he he has seen an eye specialist since his hospitalization and no abnormalities were found.  Patient does have some issues with dizziness as well and has issues with his balance since his CVA.  Patient also has some mild left upper and left lower extremity weakness that tends to be more bothersome when he is tired.      Patient reports that he is taking his blood pressure medication daily.  Patient reports that he was also placed on a cholesterol medication.  Patient however continues to smoke about 10 to 15 cigarettes/day which is a decrease from the 1 pack/day of cigarettes that he was smoking prior to his stroke.  Patient would like to eventually stop smoking as he knows that this is not good for his health.  Patient states that prior to his stroke he was drinking alcohol daily but has now cut down to 1 drink.  Patient states that overall he has felt well since his stroke.  Patient however has noticed onset of swelling in his feet a few weeks ago which was present for about a week and then resolved.  Patient does work at a job  that requires a lot of standing.  Patient states that he has had no past surgeries other than repair of the orbital fracture and cranial procedure due to his stroke.  Patient's family history is significant for his father having hypertension and several strokes.  Patient's mother has had hypertension and diabetes.  Patient reports that prior to his stroke, he did snore but patient states that his wife states that he no longer snores since his hospital discharge.  Patient thinks that he has lost about 30 pounds since his initial hospitalization and he believes that this may be why he no longer snores.  Past Medical History:  Diagnosis Date  . 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 Hypertension    Past Surgical History:  Procedure Laterality Date  . CRANIOTOMY N/A 02/27/2018   Procedure: CRANIECTOMY FOR SUBOCCIPTAL HEMORRHAGE;  Surgeon: Tia AlertJones, [name], MD;  [facility];  Service: Neurosurgery;  Laterality: N/A;  . FACIAL LACERATION REPAIR Left 02/27/2018   Procedure: EYELID  LACERATION CLOSURE;  Surgeon: Flo ShanksWolicki, Karol, MD;  [facility];  Service: ENT;  Laterality: Left;   Family History  Problem Relation Age of Onset  . Hypertension Mother   . Diabetes Mother   . Hypertension Father   . CVA Father    Social History   Tobacco Use  . Smoking status: Current Every  Day Smoker    Types: Cigarettes  . Smokeless tobacco: Never Used  Substance Use Topics  . Alcohol use: Yes    Comment: occ  . Drug use: No  No Known Allergies   Review of Systems  Constitutional: Negative for chills, fatigue and fever.  Respiratory: Negative for cough and shortness of breath.   Cardiovascular: Positive for leg swelling. Negative for chest pain and palpitations.  Gastrointestinal: Negative for abdominal pain and nausea.  Genitourinary: Positive for frequency. Negative for dysuria.  Musculoskeletal: Negative for arthralgias and joint swelling.  Neurological: Positive for dizziness and weakness.  Negative for headaches.       Objective:   Physical Exam BP (!) 144/81   Pulse 74   Temp 98.6 F (37 C) (Oral)   Resp 16   Ht 5' 5.2" (1.656 m)   Wt 220 lb 3.2 oz (99.9 kg)   SpO2 100%   BMI 36.42 kg/m  Vital signs and nurse's note reviewed General-well-nourished, well-developed, overweight male in no acute distress EENT- TMs gray,  extraocular movements are intact and conjunctival are normal in appearance.  Normal intranasal exam.  Patient with a slightly narrow posterior pharynx/airway and patient with prominence of tonsils. Neck-supple, no lymphadenopathy, no thyromegaly, no carotid bruit Lungs-clear to auscultation bilaterally. Cardiovascular-regular rate and rhythm Abdomen- mild truncal obesity, soft and nontender Back-no CVA tenderness, patient with some mild thoracolumbar paraspinous spasm Extremities- no presence of edema at today's visit Neuro- cranial nerves II through XII are grossly intact.  Patient has 5/5 upper and lower extremity strength but patient with mild decreased strength in the left lower extremity versus right     Assessment & Plan:  1. History of cerebellar hemorrhage Patient's hospital notes were reviewed from his hospital admission from February 19, 2018 through April 26,2019 for treatment of a cerebellar hemorrhage, left orbital fracture and hypertension.  Patient was placed on medication for his blood, hydrochlorothiazide, Bystolic and on generic Crestor.  I did not see the lipid panel results but patient did have triglyceride level that was elevated.  Patient also with some elevated glucose levels done during his hospitalization.  Patient will have CMP in follow-up of medication use as well as hemoglobin A1c at today's visit.  Discussed importance of smoking cessation, as well as compliance with blood pressure and cholesterol medication and regular monitoring of his blood pressure. - Comprehensive metabolic panel  2. Essential hypertension Patient is  currently on hydrochlorothiazide and Bystolic for control of blood pressure.  Patient with a mild increase in blood pressure today's visit which she attributes to being nervous about today's visit.  Patient did have some reduction in blood pressure with recheck after the visit.  Importance of months with blood pressure medications, monitoring of blood pressure and low-sodium diet were discussed.  Patient was given educational material on hypertension as well as a DASH diet as part of his after visit summary. - Comprehensive metabolic panel - nebivolol (BYSTOLIC) 10 MG tablet; Take 1 tablet (10 mg total) by mouth daily.  Dispense: 60 tablet; Refill: 1 - hydrochlorothiazide (HYDRODIURIL) 25 MG tablet; Take 1 tablet (25 mg total) by mouth daily.  Dispense: 30 tablet; Refill: 6  3. Hyperlipidemia, unspecified hyperlipidemia type Patient will return for fasting lipid panel.  New prescription provided generic Crestor.  Patient will have CMP in follow-up of medication use - Lipid panel; Future - rosuvastatin (CRESTOR) 10 MG tablet; Take 1 tablet (10 mg total) by mouth daily.  Dispense: 30 tablet; Refill: 6 -  CBC with Differential; Future  4. Obesity (BMI 35.0-39.9 without comorbidity) Patient with obesity.  Patient does report that he has had some weight loss during his hospitalization but patient continues to be overweight for his height.  Low-fat diet is encouraged as well as exercise with goal of weight loss.  Patient will have hemoglobin A1c at today's visit - HgB A1c  5. Encounter for long-term (current) use of medications Patient will have CMP and hemoglobin A1c in follow-up of long-term use of medications - Comprehensive metabolic panel - Lipid panel; Future - HgB A1c  6. History of reduction of orbital fracture Patient states that he has no pain at the site of surgical repair of orbital fracture but he continues to have abnormal foreign body sensation in the left eye.  Patient reports that he  has seen an eye care specialist and no abnormalities were found.  *Patient was offered influenza immunization which he declined at today's visit  An After Visit Summary was printed and given to the patient.  Return in about 4 months (around 12/09/2018) for fasting labs; Marland Kitchen

## 2018-08-09 NOTE — Patient Instructions (Signed)
DASH Eating Plan DASH stands for "Dietary Approaches to Stop Hypertension." The DASH eating plan is a healthy eating plan that has been shown to reduce high blood pressure (hypertension). It may also reduce your risk for type 2 diabetes, heart disease, and stroke. The DASH eating plan may also help with weight loss. What are tips for following this plan? General guidelines  Avoid eating more than 2,300 mg (milligrams) of salt (sodium) a day. If you have hypertension, you may need to reduce your sodium intake to 1,500 mg a day.  Limit alcohol intake to no more than 1 drink a day for nonpregnant women and 2 drinks a day for men. One drink equals 12 oz of beer, 5 oz of wine, or 1 oz of hard liquor.  Work with your health care provider to maintain a healthy body weight or to lose weight. Ask what an ideal weight is for you.  Get at least 30 minutes of exercise that causes your heart to beat faster (aerobic exercise) most days of the week. Activities may include walking, swimming, or biking.  Work with your health care provider or diet and nutrition specialist (dietitian) to adjust your eating plan to your individual calorie needs. Reading food labels  Check food labels for the amount of sodium per serving. Choose foods with less than 5 percent of the Daily Value of sodium. Generally, foods with less than 300 mg of sodium per serving fit into this eating plan.  To find whole grains, look for the word "whole" as the first word in the ingredient list. Shopping  Buy products labeled as "low-sodium" or "no salt added."  Buy fresh foods. Avoid canned foods and premade or frozen meals. Cooking  Avoid adding salt when cooking. Use salt-free seasonings or herbs instead of table salt or sea salt. Check with your health care provider or pharmacist before using salt substitutes.  Do not fry foods. Cook foods using healthy methods such as baking, boiling, grilling, and broiling instead.  Cook with  heart-healthy oils, such as olive, canola, soybean, or sunflower oil. Meal planning   Eat a balanced diet that includes: ? 5 or more servings of fruits and vegetables each day. At each meal, try to fill half of your plate with fruits and vegetables. ? Up to 6-8 servings of whole grains each day. ? Less than 6 oz of lean meat, poultry, or fish each day. A 3-oz serving of meat is about the same size as a deck of cards. One egg equals 1 oz. ? 2 servings of low-fat dairy each day. ? A serving of nuts, seeds, or beans 5 times each week. ? Heart-healthy fats. Healthy fats called Omega-3 fatty acids are found in foods such as flaxseeds and coldwater fish, like sardines, salmon, and mackerel.  Limit how much you eat of the following: ? Canned or prepackaged foods. ? Food that is high in trans fat, such as fried foods. ? Food that is high in saturated fat, such as fatty meat. ? Sweets, desserts, sugary drinks, and other foods with added sugar. ? Full-fat dairy products.  Do not salt foods before eating.  Try to eat at least 2 vegetarian meals each week.  Eat more home-cooked food and less restaurant, buffet, and fast food.  When eating at a restaurant, ask that your food be prepared with less salt or no salt, if possible. What foods are recommended? The items listed may not be a complete list. Talk with your dietitian about what   dietary choices are best for you. Grains Whole-grain or whole-wheat bread. Whole-grain or whole-wheat pasta. Brown rice. Oatmeal. Quinoa. Bulgur. Whole-grain and low-sodium cereals. Pita bread. Low-fat, low-sodium crackers. Whole-wheat flour tortillas. Vegetables Fresh or frozen vegetables (raw, steamed, roasted, or grilled). Low-sodium or reduced-sodium tomato and vegetable juice. Low-sodium or reduced-sodium tomato sauce and tomato paste. Low-sodium or reduced-sodium canned vegetables. Fruits All fresh, dried, or frozen fruit. Canned fruit in natural juice (without  added sugar). Meat and other protein foods Skinless chicken or turkey. Ground chicken or turkey. Pork with fat trimmed off. Fish and seafood. Egg whites. Dried beans, peas, or lentils. Unsalted nuts, nut butters, and seeds. Unsalted canned beans. Lean cuts of beef with fat trimmed off. Low-sodium, lean deli meat. Dairy Low-fat (1%) or fat-free (skim) milk. Fat-free, low-fat, or reduced-fat cheeses. Nonfat, low-sodium ricotta or cottage cheese. Low-fat or nonfat yogurt. Low-fat, low-sodium cheese. Fats and oils Soft margarine without trans fats. Vegetable oil. Low-fat, reduced-fat, or light mayonnaise and salad dressings (reduced-sodium). Canola, safflower, olive, soybean, and sunflower oils. Avocado. Seasoning and other foods Herbs. Spices. Seasoning mixes without salt. Unsalted popcorn and pretzels. Fat-free sweets. What foods are not recommended? The items listed may not be a complete list. Talk with your dietitian about what dietary choices are best for you. Grains Baked goods made with fat, such as croissants, muffins, or some breads. Dry pasta or rice meal packs. Vegetables Creamed or fried vegetables. Vegetables in a cheese sauce. Regular canned vegetables (not low-sodium or reduced-sodium). Regular canned tomato sauce and paste (not low-sodium or reduced-sodium). Regular tomato and vegetable juice (not low-sodium or reduced-sodium). Pickles. Olives. Fruits Canned fruit in a light or heavy syrup. Fried fruit. Fruit in cream or butter sauce. Meat and other protein foods Fatty cuts of meat. Ribs. Fried meat. Bacon. Sausage. Bologna and other processed lunch meats. Salami. Fatback. Hotdogs. Bratwurst. Salted nuts and seeds. Canned beans with added salt. Canned or smoked fish. Whole eggs or egg yolks. Chicken or turkey with skin. Dairy Whole or 2% milk, cream, and half-and-half. Whole or full-fat cream cheese. Whole-fat or sweetened yogurt. Full-fat cheese. Nondairy creamers. Whipped toppings.  Processed cheese and cheese spreads. Fats and oils Butter. Stick margarine. Lard. Shortening. Ghee. Bacon fat. Tropical oils, such as coconut, palm kernel, or palm oil. Seasoning and other foods Salted popcorn and pretzels. Onion salt, garlic salt, seasoned salt, table salt, and sea salt. Worcestershire sauce. Tartar sauce. Barbecue sauce. Teriyaki sauce. Soy sauce, including reduced-sodium. Steak sauce. Canned and packaged gravies. Fish sauce. Oyster sauce. Cocktail sauce. Horseradish that you find on the shelf. Ketchup. Mustard. Meat flavorings and tenderizers. Bouillon cubes. Hot sauce and Tabasco sauce. Premade or packaged marinades. Premade or packaged taco seasonings. Relishes. Regular salad dressings. Where to find more information:  National Heart, Lung, and Blood Institute: www.nhlbi.nih.gov  American Heart Association: www.heart.org Summary  The DASH eating plan is a healthy eating plan that has been shown to reduce high blood pressure (hypertension). It may also reduce your risk for type 2 diabetes, heart disease, and stroke.  With the DASH eating plan, you should limit salt (sodium) intake to 2,300 mg a day. If you have hypertension, you may need to reduce your sodium intake to 1,500 mg a day.  When on the DASH eating plan, aim to eat more fresh fruits and vegetables, whole grains, lean proteins, low-fat dairy, and heart-healthy fats.  Work with your health care provider or diet and nutrition specialist (dietitian) to adjust your eating plan to your individual   calorie needs. This information is not intended to replace advice given to you by your health care provider. Make sure you discuss any questions you have with your health care provider. Document Released: 10/26/2011 Document Revised: 10/30/2016 Document Reviewed: 10/30/2016 Elsevier Interactive Patient Education  2018 [employer].  Hypertension Hypertension is another name for high blood pressure. High blood pressure  forces your heart to work harder to pump blood. This can cause problems over time. There are two numbers in a blood pressure reading. There is a top number (systolic) over a bottom number (diastolic). It is best to have a blood pressure below 120/80. Healthy choices can help lower your blood pressure. You may need medicine to help lower your blood pressure if:  Your blood pressure cannot be lowered with healthy choices.  Your blood pressure is higher than 130/80.  Follow these instructions at home: Eating and drinking  If directed, follow the DASH eating plan. This diet includes: ? Filling half of your plate at each meal with fruits and vegetables. ? Filling one quarter of your plate at each meal with whole grains. Whole grains include whole wheat pasta, brown rice, and whole grain bread. ? Eating or drinking low-fat dairy products, such as skim milk or low-fat yogurt. ? Filling one quarter of your plate at each meal with low-fat (lean) proteins. Low-fat proteins include fish, skinless chicken, eggs, beans, and tofu. ? Avoiding fatty meat, cured and processed meat, or chicken with skin. ? Avoiding premade or processed food.  Eat less than 1,500 mg of salt (sodium) a day.  Limit alcohol use to no more than 1 drink a day for nonpregnant women and 2 drinks a day for men. One drink equals 12 oz of beer, 5 oz of wine, or 1 oz of hard liquor. Lifestyle  Work with your doctor to stay at a healthy weight or to lose weight. Ask your doctor what the best weight is for you.  Get at least 30 minutes of exercise that causes your heart to beat faster (aerobic exercise) most days of the week. This may include walking, swimming, or biking.  Get at least 30 minutes of exercise that strengthens your muscles (resistance exercise) at least 3 days a week. This may include lifting weights or pilates.  Do not use any products that contain nicotine or tobacco. This includes cigarettes and e-cigarettes. If you  need help quitting, ask your doctor.  Check your blood pressure at home as told by your doctor.  Keep all follow-up visits as told by your doctor. This is important. Medicines  Take over-the-counter and prescription medicines only as told by your doctor. Follow directions carefully.  Do not skip doses of blood pressure medicine. The medicine does not work as well if you skip doses. Skipping doses also puts you at risk for problems.  Ask your doctor about side effects or reactions to medicines that you should watch for. Contact a doctor if:  You think you are having a reaction to the medicine you are taking.  You have headaches that keep coming back (recurring).  You feel dizzy.  You have swelling in your ankles.  You have trouble with your vision. Get help right away if:  You get a very bad headache.  You start to feel confused.  You feel weak or numb.  You feel faint.  You get very bad pain in your: ? Chest. ? Belly (abdomen).  You throw up (vomit) more than once.  You have trouble breathing.   Summary  Hypertension is another name for high blood pressure.  Making healthy choices can help lower blood pressure. If your blood pressure cannot be controlled with healthy choices, you may need to take medicine. This information is not intended to replace advice given to you by your health care provider. Make sure you discuss any questions you have with your health care provider. Document Released: 04/24/2008 Document Revised: 10/04/2016 Document Reviewed: 10/04/2016 Elsevier Interactive Patient Education  2018 Elsevier Inc.  

## 2018-08-10 LAB — COMPREHENSIVE METABOLIC PANEL WITH GFR
ALT: 28 IU/L (ref 0–44)
AST: 20 IU/L (ref 0–40)
Albumin/Globulin Ratio: 1.9 (ref 1.2–2.2)
Albumin: 4.5 g/dL (ref 3.5–5.5)
Alkaline Phosphatase: 121 IU/L — ABNORMAL HIGH (ref 39–117)
BUN/Creatinine Ratio: 9 (ref 9–20)
BUN: 12 mg/dL (ref 6–24)
Bilirubin Total: 0.4 mg/dL (ref 0.0–1.2)
CO2: 23 mmol/L (ref 20–29)
Calcium: 9.5 mg/dL (ref 8.7–10.2)
Chloride: 99 mmol/L (ref 96–106)
Creatinine, Ser: 1.29 mg/dL — ABNORMAL HIGH (ref 0.76–1.27)
GFR calc Af Amer: 75 mL/min/1.73
GFR calc non Af Amer: 65 mL/min/1.73
Globulin, Total: 2.4 g/dL (ref 1.5–4.5)
Glucose: 97 mg/dL (ref 65–99)
Potassium: 4.1 mmol/L (ref 3.5–5.2)
Sodium: 142 mmol/L (ref 134–144)
Total Protein: 6.9 g/dL (ref 6.0–8.5)

## 2018-08-13 ENCOUNTER — Telehealth: Payer: Self-pay

## 2018-08-13 NOTE — Telephone Encounter (Signed)
Contacted pt to go over lab results pt didn't answer lvm asking pt to give me a call at his earliest convenience   If pt calls back please give results: mild increase in his creatinine at 1.29 with normal of 0.76-1.27. This is likely a result of his long-standing hypertension and this is improved from creatinine of 1.51 in April of this year during his hospitalization. Patient with a very mild increase in alk phosphatase which is a nonspecific enzyme found in the liver but is also found in bowel and salivary glands therefore can be elevated in the presence of joint pain, arthritis and not just related to the liver. Alkaline phosphatase is 121 with normal being 39-117.

## 2018-08-14 ENCOUNTER — Telehealth: Payer: Self-pay | Admitting: Family Medicine

## 2018-08-14 NOTE — Telephone Encounter (Signed)
Patient called requesting lab results, please follow up °

## 2018-08-14 NOTE — Telephone Encounter (Signed)
Returned pt call pt didn't answer

## 2018-09-23 ENCOUNTER — Ambulatory Visit: Payer: BLUE CROSS/BLUE SHIELD | Attending: Family Medicine

## 2018-09-23 DIAGNOSIS — E785 Hyperlipidemia, unspecified: Secondary | ICD-10-CM

## 2018-09-23 DIAGNOSIS — Z79899 Other long term (current) drug therapy: Secondary | ICD-10-CM | POA: Diagnosis not present

## 2018-09-23 NOTE — Progress Notes (Signed)
Patient here for lab visit only 

## 2018-09-24 LAB — CBC WITH DIFFERENTIAL/PLATELET
Basophils Absolute: 0.1 x10E3/uL (ref 0.0–0.2)
Basos: 1 %
EOS (ABSOLUTE): 1.1 x10E3/uL — ABNORMAL HIGH (ref 0.0–0.4)
Eos: 11 %
Hematocrit: 48.9 % (ref 37.5–51.0)
Hemoglobin: 15.3 g/dL (ref 13.0–17.7)
Immature Grans (Abs): 0 x10E3/uL (ref 0.0–0.1)
Immature Granulocytes: 0 %
Lymphocytes Absolute: 2.9 x10E3/uL (ref 0.7–3.1)
Lymphs: 30 %
MCH: 26.7 pg (ref 26.6–33.0)
MCHC: 31.3 g/dL — ABNORMAL LOW (ref 31.5–35.7)
MCV: 85 fL (ref 79–97)
Monocytes Absolute: 0.8 x10E3/uL (ref 0.1–0.9)
Monocytes: 9 %
Neutrophils Absolute: 4.6 x10E3/uL (ref 1.4–7.0)
Neutrophils: 49 %
Platelets: 262 x10E3/uL (ref 150–450)
RBC: 5.73 x10E6/uL (ref 4.14–5.80)
RDW: 13.9 % (ref 12.3–15.4)
WBC: 9.4 x10E3/uL (ref 3.4–10.8)

## 2018-09-24 LAB — LIPID PANEL
Chol/HDL Ratio: 3.2 ratio (ref 0.0–5.0)
Cholesterol, Total: 145 mg/dL (ref 100–199)
HDL: 45 mg/dL
LDL Calculated: 82 mg/dL (ref 0–99)
Triglycerides: 91 mg/dL (ref 0–149)
VLDL Cholesterol Cal: 18 mg/dL (ref 5–40)

## 2018-09-26 ENCOUNTER — Telehealth (INDEPENDENT_AMBULATORY_CARE_PROVIDER_SITE_OTHER): Payer: Self-pay | Admitting: *Deleted

## 2018-09-26 NOTE — Telephone Encounter (Signed)
MA left a message with patients wife who is not on current DPR to have patient return a phone call. !!!Please inform patient of labs being normal and to continue with current medications and exercise with healthy diet!!!

## 2018-09-26 NOTE — Telephone Encounter (Signed)
-----   Message from Cain Saupe, MD sent at 09/26/2018 12:24 AM EST ----- Please notify patient of normal complete blood count and normal lipid panel.  Continue medication and a low-fat diet and exercise as tolerated

## 2018-11-26 ENCOUNTER — Ambulatory Visit: Payer: BLUE CROSS/BLUE SHIELD | Attending: Family Medicine | Admitting: Family Medicine

## 2018-11-26 ENCOUNTER — Encounter: Payer: Self-pay | Admitting: Family Medicine

## 2018-11-26 VITALS — BP 130/84 | HR 65 | Temp 98.8°F | Resp 18 | Ht 65.0 in | Wt 222.0 lb

## 2018-11-26 DIAGNOSIS — Z79899 Other long term (current) drug therapy: Secondary | ICD-10-CM

## 2018-11-26 DIAGNOSIS — I10 Essential (primary) hypertension: Secondary | ICD-10-CM | POA: Diagnosis not present

## 2018-11-26 DIAGNOSIS — E669 Obesity, unspecified: Secondary | ICD-10-CM

## 2018-11-26 DIAGNOSIS — Z1211 Encounter for screening for malignant neoplasm of colon: Secondary | ICD-10-CM

## 2018-11-26 DIAGNOSIS — Z720 Tobacco use: Secondary | ICD-10-CM

## 2018-11-26 DIAGNOSIS — Z6836 Body mass index (BMI) 36.0-36.9, adult: Secondary | ICD-10-CM

## 2018-11-26 DIAGNOSIS — Z8781 Personal history of (healed) traumatic fracture: Secondary | ICD-10-CM

## 2018-11-26 DIAGNOSIS — Z9889 Other specified postprocedural states: Secondary | ICD-10-CM

## 2018-11-26 DIAGNOSIS — E785 Hyperlipidemia, unspecified: Secondary | ICD-10-CM | POA: Diagnosis not present

## 2018-11-26 DIAGNOSIS — Z8679 Personal history of other diseases of the circulatory system: Secondary | ICD-10-CM

## 2018-11-26 DIAGNOSIS — K1370 Unspecified lesions of oral mucosa: Secondary | ICD-10-CM

## 2018-11-26 MED ORDER — NEBIVOLOL HCL 10 MG PO TABS: 10.0000 mg | ORAL_TABLET | Freq: Every day | ORAL | 6 refills | Status: DC

## 2018-11-26 MED ORDER — ROSUVASTATIN CALCIUM 10 MG PO TABS: 10.0000 mg | ORAL_TABLET | Freq: Every day | ORAL | 6 refills | Status: DC

## 2018-11-26 MED ORDER — INDAPAMIDE 1.25 MG PO TABS: 1.2500 mg | ORAL_TABLET | Freq: Every day | ORAL | 6 refills | Status: DC

## 2018-11-26 NOTE — Patient Instructions (Signed)

## 2018-11-26 NOTE — Progress Notes (Signed)
Subjective:    Patient ID: Austin Leonard, male    DOB: Sep 03, 1966, 53 y.o.   MRN: [id number]  HPI       53 yo male who was seen on 08/09/2018 for new patient visit status post hospitalization in April of this year secondary to a cerebellar hemorrhage, hypertension and left orbital fracture.  Patient is seen in follow-up of hypertension and hyperlipidemia.Patient is currently on Bystolic and hydrochlorothiazide.  Patient is also on Crestor 20 mg for hyperlipidemia.  Patient did have mild increase in serum creatinine at 1.29 with normal of 0.76-1.27 on blood work done 08/09/2018.  Patient also had a very mild increase in alkaline phosphatase at 121 with normal being 39-117.  Hemoglobin A1c was normal at 5.4.      Patient reports that he continues to do well at today's visit.  Patient reports that he does need refill of his blood pressure medication.  Patient has been taking his medication daily.  Patient denies any headaches or dizziness related to his blood pressure.  Patient has had no issues with focal numbness or weakness, no syncopal episodes since his last visit.  Patient still has occasional issues with his balance since his cerebral hemorrhage/CVA last April.  Patient states that occasionally when he is walking he will drift to the side a little bit and this tends to happen when he is more fatigued.  Patient denies any issues with facial or eye pain status post orbital fracture.  Patient denies any blurred vision or double vision.  Patient has seen an eye doctor since his hospital discharge and he was told that everything was fine.  Patient continues to take his Crestor for hyperlipidemia.  Patient denies any increase in muscle or joint pain related to his use of cholesterol medication.  Patient reports that he does continue to smoke.  Patient denies any shortness of breath or cough.  Patient has turned 52 since his last visit and patient is okay with referral to have screening colonoscopy.  Patient does  not wish to have influenza immunization.   Review of Systems  Constitutional: Negative for chills, fatigue and fever.  HENT: Negative for congestion, sore throat and trouble swallowing.   Eyes: Negative for photophobia and visual disturbance.  Respiratory: Negative for cough and shortness of breath.   Cardiovascular: Negative for chest pain, palpitations and leg swelling.  Gastrointestinal: Negative for abdominal pain, constipation, diarrhea and nausea.  Endocrine: Negative for polydipsia, polyphagia and polyuria.  Genitourinary: Negative for dysuria and frequency.  Musculoskeletal: Negative for arthralgias, back pain, gait problem and joint swelling.  Neurological: Negative for dizziness, numbness and headaches.  Hematological: Negative for adenopathy. Does not bruise/bleed easily.       Objective:   Physical Exam BP 130/84 (BP Location: Left Arm, Patient Position: Sitting, Cuff Size: Normal)   Pulse 65   Temp 98.8 F (37.1 C) (Oral)   Resp 18   Ht 5\' 5"  (1.651 m)   Wt 222 lb (100.7 kg)   SpO2 100%   BMI 36.94 kg/m nurses notes and vital signs reviewed General-well-nourished, well-developed, overweight male in no acute distress EENT- conjunctiva are normal, extraocular movements are intact with the exception of a very mild lack of left eye movement which occurs when patient looks to the far left then slowly brings his gaze back to the center.  TMs gray, nares with mild edema of the nasal turbinates, normal oropharynx with mild posterior pharynx erythema and slightly narrowed posterior airway; patient with  some erythema of the oral mucosa and patient with a whitish lesion on the right mid side of the uvula Neck-supple, no lymphadenopathy, no thyromegaly, no carotid bruit Lungs-clear to auscultation bilaterally Cardiovascular-regular rate and rhythm Abdomen-mild truncal obesity, soft and nontender Extremities-no edema Neuro-cranial nerves II through XII are grossly intact      Assessment & Plan:  1. Essential hypertension Patient is provided with refills of his blood pressure medications at today's visit including Bystolic, indapamide and patient is also on hydrochlorothiazide since hospital discharge.  Information given on DASH diet as part of AVS.  Patient is encouraged to lose weight and exercise on a regular basis. - Comprehensive metabolic panel - nebivolol (BYSTOLIC) 10 MG tablet; Take 1 tablet (10 mg total) by mouth daily.  Dispense: 60 tablet; Refill: 6 - indapamide (LOZOL) 1.25 MG tablet; Take 1 tablet (1.25 mg total) by mouth daily.  Dispense: 30 tablet; Refill: 6  2. Hyperlipidemia, unspecified hyperlipidemia type Patient is encouraged to continue the use of Crestor 10 mg and a healthy diet.  Patient's LDL goal is 70 or less secondary to prior CVA, hypertension and obesity.  Patient will have CMP in follow-up of medication use - Comprehensive metabolic panel - rosuvastatin (CRESTOR) 10 MG tablet; Take 1 tablet (10 mg total) by mouth daily.  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  Patient reports that he occasionally has some issues with balance especially when he is tired which is a residual effect of his cerebellar hemorrhage  4. Oral lesion Patient with an oral lesion on exam and I discussed with the patient that this could represent a precancerous lesion or cancerous lesion as patient also has long-term use of tobacco/cigarettes.  Discussed the need for complete smoking cessation as well as referral to ENT at today's visit - Ambulatory referral to ENT  5. Screening for colon cancer Patient has turned 52 since his last visit and patient will be referred to gastroenterology for screening colonoscopy - Ambulatory referral to Gastroenterology  6. Tobacco use The need for complete smoking cessation  discussed with the patient.  Also made patient aware that clinical pharmacist is available to meet with him for further counseling and review of medications to help with smoking cessation.  7. Obesity (BMI 35.0-39.9 without comorbidity) Patient with obesity and a healthy, low carbohydrate, low processed food diet was encouraged along with regular exercise  8. Encounter for long-term (current) use of medications Patient will have CMP in follow-up of long-term use of medications for treatment of hypertension and hyperlipidemia at today's visit - Comprehensive metabolic panel  9. History of reduction of orbital fracture Patient with history of a left orbital fracture status post syncopal episode when he had his cerebellar hemorrhage.  Patient denies any visual difficulties and reports that he has seen an eye specialist status post hospital discharge  An After Visit Summary was printed and given to the patient.  Allergies as of 11/26/2018   No Known Allergies     Medication List       Accurate as of November 26, 2018 11:59 PM. Always use your most recent med list.        acetaminophen 500 MG tablet Commonly known as:  TYLENOL Take 1,000 mg by mouth every 8 (eight) hours as needed (pain).   hydrochlorothiazide 25 MG tablet Commonly known as:  HYDRODIURIL Take 1 tablet (25 mg total) by  mouth daily.   ibuprofen 200 MG tablet Commonly known as:  ADVIL,MOTRIN Take 600 mg by mouth every 6 (six) hours as needed (pain).   indapamide 1.25 MG tablet Commonly known as:  LOZOL Take 1 tablet (1.25 mg total) by mouth daily.   nebivolol 10 MG tablet Commonly known as:  BYSTOLIC Take 1 tablet (10 mg total) by mouth daily.   rosuvastatin 10 MG tablet Commonly known as:  CRESTOR Take 1 tablet (10 mg total) by mouth daily.       Return in about 4 months (around 03/27/2019) for HTN/lipids; fasting labs at next visit.

## 2018-11-27 ENCOUNTER — Encounter: Payer: Self-pay | Admitting: Family Medicine

## 2018-11-27 LAB — COMPREHENSIVE METABOLIC PANEL WITH GFR
ALT: 26 IU/L (ref 0–44)
AST: 22 IU/L (ref 0–40)
Albumin/Globulin Ratio: 1.7 (ref 1.2–2.2)
Albumin: 4.5 g/dL (ref 3.5–5.5)
Alkaline Phosphatase: 91 IU/L (ref 39–117)
BUN/Creatinine Ratio: 15 (ref 9–20)
BUN: 18 mg/dL (ref 6–24)
Bilirubin Total: 0.3 mg/dL (ref 0.0–1.2)
CO2: 24 mmol/L (ref 20–29)
Calcium: 9.7 mg/dL (ref 8.7–10.2)
Chloride: 101 mmol/L (ref 96–106)
Creatinine, Ser: 1.21 mg/dL (ref 0.76–1.27)
GFR calc Af Amer: 80 mL/min/1.73
GFR calc non Af Amer: 69 mL/min/1.73
Globulin, Total: 2.6 g/dL (ref 1.5–4.5)
Glucose: 90 mg/dL (ref 65–99)
Potassium: 4.4 mmol/L (ref 3.5–5.2)
Sodium: 141 mmol/L (ref 134–144)
Total Protein: 7.1 g/dL (ref 6.0–8.5)

## 2018-11-27 MED ORDER — HYDROCHLOROTHIAZIDE 25 MG PO TABS: 25.0000 mg | ORAL_TABLET | Freq: Every day | ORAL | 6 refills | Status: DC

## 2018-12-02 ENCOUNTER — Telehealth: Payer: Self-pay | Admitting: *Deleted

## 2018-12-02 NOTE — Telephone Encounter (Signed)
-----   Message from Cain Saupe, MD sent at 11/27/2018  8:56 AM EST ----- Please notify patient that his CMET- electrolytes and liver enzymes were normal

## 2018-12-02 NOTE — Telephone Encounter (Signed)
Medical Assistant left message on patient's home and cell voicemail. Voicemail states to give a call back to Nubia with [facility] at [phone]. Patient is aware of labs being normal 

## 2019-03-27 ENCOUNTER — Encounter: Payer: Self-pay | Admitting: Family Medicine

## 2019-03-27 ENCOUNTER — Ambulatory Visit: Payer: BLUE CROSS/BLUE SHIELD | Attending: Family Medicine | Admitting: Family Medicine

## 2019-03-27 ENCOUNTER — Other Ambulatory Visit: Payer: Self-pay

## 2019-03-27 DIAGNOSIS — E669 Obesity, unspecified: Secondary | ICD-10-CM

## 2019-03-27 DIAGNOSIS — E785 Hyperlipidemia, unspecified: Secondary | ICD-10-CM | POA: Diagnosis not present

## 2019-03-27 DIAGNOSIS — K1370 Unspecified lesions of oral mucosa: Secondary | ICD-10-CM

## 2019-03-27 DIAGNOSIS — Z8679 Personal history of other diseases of the circulatory system: Secondary | ICD-10-CM

## 2019-03-27 DIAGNOSIS — Z79899 Other long term (current) drug therapy: Secondary | ICD-10-CM

## 2019-03-27 DIAGNOSIS — I10 Essential (primary) hypertension: Secondary | ICD-10-CM

## 2019-03-27 DIAGNOSIS — Z720 Tobacco use: Secondary | ICD-10-CM

## 2019-03-27 NOTE — Progress Notes (Signed)
Virtual Visit via Telephone Note  I connected with Austin Leonard on 03/27/19 at  8:30 AM EDT by telephone and verified that I am speaking with the correct person using two identifiers.   I discussed the limitations, risks, security and privacy concerns of performing an evaluation and management service by telephone and the availability of in person appointments. I also discussed with the patient that there may be a patient responsible charge related to this service. The patient expressed understanding and agreed to proceed.  Patient Location: Home Provider Location: Office Others participating in call: Guillerm[place], RMA   History of Present Illness:      53 year old male with chronic medical issues including hypertension, hyperlipidemia and history of cerebellar CVA who is following up on his medical issues for continued medical management of chronic diseases.  Patient has no new complaints at today's visit.  Patient was also noted to have an oral lesion on exam at his last visit.  Patient states that he was contacted by ear nose and throat and that the patient's appointment has been postponed due to current COVID-19 pandemic.  Patient is also awaiting rescheduling of colonoscopy by gastroenterology.  Patient reports that he continues to smoke but he is now down to about 12 cigarettes/day and plans to continue to slowly decrease every time.  He has not set a firm quit date.  Patient reports that his blood pressure continues to be well controlled on his current medications.  He has had no issues with headaches or dizziness related to blood pressure and no dizziness or loss of balance related to his cerebellar stroke.  Patient reports that he is taking Crestor without difficulty.  No increased muscle or joint pain with use of Crestor.  Patient reports that he has been furloughed from his job due to COVID-19 but is remaining active.  Patient states that overall he feels well at today's visit.   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 OR;  Service: Neurosurgery;  Laterality: N/A;   FACIAL LACERATION REPAIR Left 02/27/2018   Procedure: EYELID  LACERATION CLOSURE;  Surgeon: Flo Shanks, MD;  Location: [facility] OR;  Service: ENT;  Laterality: Left;    Family History  Problem Relation Age of Onset   Hypertension Mother    Diabetes Mother    Hypertension Father    CVA Father     Social History   Tobacco Use   Smoking status: Current Every Day Smoker    Packs/day: 0.50    Types: Cigarettes   Smokeless tobacco: Never Used  Substance Use Topics   Alcohol use: Yes    Comment: occ   Drug use: No     No Known Allergies    Review of Systems  Constitutional: Negative for chills and fever.  HENT: Negative for congestion and sore throat.   Eyes: Negative for photophobia and pain.  Respiratory: Negative for cough and shortness of breath.   Cardiovascular: Negative for chest pain and palpitations.  Gastrointestinal: Negative for abdominal pain, constipation, diarrhea, heartburn, nausea and vomiting.  Genitourinary: Negative for dysuria and frequency.  Musculoskeletal: Negative for joint pain and myalgias.  Skin: Negative for itching and rash.  Neurological: Negative for dizziness and headaches.  Endo/Heme/Allergies: Negative for polydipsia. Does not  bruise/bleed easily.   Observations/Objective: No vital signs or physical exam conducted as visit was done via telephone due to limitations/restrictions on office visits due to the current COVID-19 pandemic  Assessment and Plan: 1. Essential hypertension Patient will continue his current medications for control of blood pressure including hydrochlorothiazide, indapamide and Bystolic.  Patient's  blood pressure at his last visit was well controlled and near goal of 130/80 or less.  Patient's labs from his last visit were reviewed with the patient today.  Continue to monitor blood pressure and call or return sooner if any issues.  Continue DASH diet and regular cardiovascular exercise  2. Hyperlipidemia, unspecified hyperlipidemia type Patient reports no issues with the use of Crestor.  Continue low-fat diet.  Continue regular exercise.  Weight loss encouraged.  Patient with most recent LDL in November 2019 at 55 and discussed LDL goal of 70 or less.  Patient will have repeat fasting lipid panel at his next office visit in October.  3.  History of cerebellar hemorrhage Stressed secondary prevention by making sure that blood pressure remains well controlled.  Patient will continue his current medications for hypertension and continue to monitor his blood pressure with goal blood pressure of 130/80 or less.  Patient denies any current dizziness or loss of balance associated with his prior cerebellar hemorrhage and no new symptoms.  4.. Tobacco use Patient has been able to reduce tobacco use to 12 cigarettes/day.  Discussed smoking cessation and patient agrees to meet with the clinical pharmacist to help with smoking cessation.  Referral placed to clinical pharmacist.  Patient is aware that the appointment will likely not occur for 4 to 6 weeks due to current COVID-19 pandemic.- Amb Referral to Clinical Pharmacist  5. Oral lesion Patient was noted to have oral lesion at his last visit.  He reports that he has been contacted by otolaryngology/ENT and that appointment has been delayed secondary to COVID-19 pandemic but will be rescheduled.  Complete smoking cessation advised.  6. Encounter for long-term (current) use of medications Patient was made aware of labs from his last visit including normal complete metabolic panel.  He will have repeat complete metabolic panel at his next visit in follow-up  of his use of medications for the treatment of hypertension and hyperlipidemia/statin therapy which can increase the risk of liver enzyme elevations and patient's blood pressure medicine can cause changes in renal function and electrolytes.  6. Obesity (BMI 35.0-39.9 without comorbidity) Patient is encouraged to follow a low-fat diet rich in fresh fruits and vegetables as well as regular cardiovascular exercise with a goal of weight loss.  Current Outpatient Medications on File Prior to Visit  Medication Sig Dispense Refill   hydrochlorothiazide (HYDRODIURIL) 25 MG tablet Take 1 tablet (25 mg total) by mouth daily. 30 tablet 6   indapamide (LOZOL) 1.25 MG tablet Take 1 tablet (1.25 mg total) by mouth daily. 30 tablet 6   nebivolol (BYSTOLIC) 10 MG tablet Take 1 tablet (10 mg total) by mouth daily. 60 tablet 6   rosuvastatin (CRESTOR) 10 MG tablet Take 1 tablet (10 mg total) by mouth daily. 30 tablet 6   acetaminophen (TYLENOL) 500 MG tablet Take 1,000 mg by mouth every 8 (eight) hours as needed (pain).     ibuprofen (ADVIL,MOTRIN) 200 MG tablet Take 600 mg by mouth every 6 (six) hours as needed (pain).     No current facility-administered medications on file prior to visit.    Follow Up Instructions:Return in about 5 months (  around 08/27/2019) for chronic issues/fasting labs.    I discussed the assessment and treatment plan with the patient. The patient was provided an opportunity to ask questions and all were answered. The patient agreed with the plan and demonstrated an understanding of the instructions.   The patient was advised to call back or seek an in-person evaluation if the symptoms worsen or if the condition fails to improve as anticipated.  I provided 12 minutes of non-face-to-face time during this encounter.   Cain Saupeammie Junette Bernat, MD

## 2019-03-27 NOTE — Progress Notes (Signed)
Follow up for HTN and Labs and Med refills

## 2019-12-02 ENCOUNTER — Other Ambulatory Visit: Payer: Self-pay | Admitting: Family Medicine

## 2019-12-02 DIAGNOSIS — I10 Essential (primary) hypertension: Secondary | ICD-10-CM

## 2019-12-21 ENCOUNTER — Other Ambulatory Visit: Payer: Self-pay

## 2019-12-21 ENCOUNTER — Ambulatory Visit
Admission: EM | Admit: 2019-12-21 | Discharge: 2019-12-21 | Disposition: A | Discharge status: Home / Self Care | Payer: BC Managed Care – PPO | Attending: Emergency Medicine | Admitting: Emergency Medicine

## 2019-12-21 ENCOUNTER — Encounter: Payer: Self-pay | Admitting: Emergency Medicine

## 2019-12-21 DIAGNOSIS — M5442 Lumbago with sciatica, left side: Secondary | ICD-10-CM

## 2019-12-21 DIAGNOSIS — I10 Essential (primary) hypertension: Secondary | ICD-10-CM

## 2019-12-21 MED ORDER — CYCLOBENZAPRINE HCL 5 MG PO TABS: 5.0000 mg | ORAL_TABLET | Freq: Two times a day (BID) | ORAL | 0 refills | Status: AC | PRN

## 2019-12-21 MED ORDER — METHYLPREDNISOLONE SODIUM SUCC 125 MG IJ SOLR
125.0000 mg | Freq: Once | INTRAMUSCULAR | Status: AC
  Administered 2019-12-21: 14:00:00 125 mg via INTRAMUSCULAR

## 2019-12-21 NOTE — ED Notes (Signed)
Patient able to ambulate independently  

## 2019-12-21 NOTE — Discharge Instructions (Addendum)
Recommend RICE: rest, ice, compression, elevation as needed for pain.    Heat therapy (hot compress, warm wash red, hot showers, etc.) can help relax muscles and soothe muscle aches. Cold therapy (ice packs) can be used to help swelling both after injury and after prolonged use of areas of chronic pain/aches.  For pain: take naproxen, may add tylenol  May take muscle relaxer as needed for severe pain / spasm.  (This medication may cause you to become tired so it is important you do not drink alcohol or operate heavy machinery while on this medication.  Recommend your first dose to be taken before bedtime to monitor for side effects safely)  Go to ER for worsening pain, inability to walk, numbness, inability to hold bowel/bladder.

## 2019-12-21 NOTE — ED Provider Notes (Signed)
[name]    CSN: [id number] Arrival date & time: 12/21/19  1230      History   Chief Complaint Chief Complaint  Patient presents with  . APPT: 1300  . Back Pain    HPI Austin Leonard is a 54 y.o. male with history of hypertension presenting for left low back pain for the last 2 weeks.  States sometimes it will radiate down the back of his leg to his foot: This is worse with certain movements or positions.  States he had a televisit the other day and was told to take naproxen, though feels this is not helping enough.  Denies trauma to the area, pop/snap/tearing sensation, previous injury.  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  Denies fever, saddle area anesthesia, lower extremity numbness, weakness, change in bowel or bladder habit.   Past Medical History:  Diagnosis Date  . 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 Hypertension     Patient Active Problem List   Diagnosis Date Noted  . History of cerebellar hemorrhage 08/09/2018  . History of reduction of orbital fracture 08/09/2018  . S/P craniotomy 02/27/2018    Past Surgical History:  Procedure Laterality Date  . CRANIOTOMY N/A 02/27/2018   Procedure: CRANIECTOMY FOR SUBOCCIPTAL HEMORRHAGE;  Surgeon: Tia Alert, MD;  Location: [facility] OR;  Service: Neurosurgery;  Laterality: N/A;  . FACIAL LACERATION REPAIR Left 02/27/2018   Procedure: EYELID  LACERATION CLOSURE;  Surgeon: Flo Shanks, MD;  Location: [facility] OR;  Service: ENT;  Laterality: Left;       Home Medications    Prior to Admission medications   Medication Sig Start Date End Date Taking? Authorizing Provider  acetaminophen (TYLENOL) 500 MG tablet Take 1,000 mg by mouth every 8 (eight) hours as needed (pain).    [provider]  cyclobenzaprine (FLEXERIL) 5 MG tablet Take 1 tablet (5 mg total) by mouth 2 (two) times daily as needed for up to 5 days for muscle spasms. 12/21/19 12/26/19   Hall-Potvin, [place], PA-C  hydrochlorothiazide (HYDRODIURIL) 25 MG tablet Take 1 tablet (25 mg total) by mouth daily. 11/27/18   Fulp, Cammie, MD  ibuprofen (ADVIL,MOTRIN) 200 MG tablet Take 600 mg by mouth every 6 (six) hours as needed (pain).    [provider]  indapamide (LOZOL) 1.25 MG tablet Take 1 tablet (1.25 mg total) by mouth daily. 11/26/18   Fulp, Cammie, MD  nebivolol (BYSTOLIC) 10 MG tablet Take 1 tablet (10 mg total) by mouth daily. 11/26/18   Fulp, Cammie, MD  rosuvastatin (CRESTOR) 10 MG tablet Take 1 tablet (10 mg total) by mouth daily. 11/26/18   Cain Saupe, MD    Family History Family History  Problem Relation Age of Onset  . Hypertension Mother   . Diabetes Mother   . Hypertension Father   . CVA Father     Social History Social History   Tobacco Use  . Smoking status: Current Every Day Smoker    Packs/day: 0.50    Types: Cigarettes  . Smokeless tobacco: Never Used  Substance Use Topics  . Alcohol use: Yes    Comment: occ  . Drug use: No     Allergies   Patient has no known allergies.   Review of Systems As per HPI   Physical Exam Triage Vital Signs ED Triage Vitals  Enc Vitals Group     BP      Pulse  Resp      Temp      Temp src      SpO2      Weight      Height      Head Circumference      Peak Flow      Pain Score      Pain Loc      Pain Edu?      Excl. in [place]?    No data found.  Updated Vital Signs BP (!) 172/108 (BP Location: Left Arm)   Pulse 89   Temp 97.8 F (36.6 C) (Temporal)   Resp 16   SpO2 98%   Visual Acuity Right Eye Distance:   Left Eye Distance:   Bilateral Distance:    Right Eye Near:   Left Eye Near:    Bilateral Near:     Physical Exam Constitutional:      General: He is not in acute distress. HENT:     Head: Normocephalic and atraumatic.  Eyes:     General: No scleral icterus.    Pupils: Pupils are equal, round, and reactive to light.  Cardiovascular:     Rate and Rhythm: Normal  rate.  Pulmonary:     Effort: Pulmonary effort is normal. No respiratory distress.     Breath sounds: No wheezing.  Musculoskeletal:     Lumbar back: Tenderness present. No swelling, deformity, spasms or bony tenderness. Decreased range of motion. Positive left straight leg raise test. Negative right straight leg raise test. No scoliosis.       Back:     Comments: Mildly decreased ROM second to pain.  Skin:    General: Skin is warm.     Capillary Refill: Capillary refill takes less than 2 seconds.     Coloration: Skin is not jaundiced or pale.  Neurological:     General: No focal deficit present.     Mental Status: He is alert and oriented to person, place, and time.      UC Treatments / Results  Labs (all labs ordered are listed, but only abnormal results are displayed) Labs Reviewed - No data to display  EKG   Radiology No results found.  Procedures Procedures (including critical care time)  Medications Ordered in UC Medications  methylPREDNISolone sodium succinate (SOLU-MEDROL) 125 mg/2 mL injection 125 mg (125 mg Intramuscular Given 12/21/19 1334)    Initial Impression / Assessment and Plan / UC Course  I have reviewed the triage vital signs and the nursing notes.  Pertinent labs & imaging results that were available during my care of the patient were reviewed by me and considered in my medical decision making (see chart for details).     Patient afebrile, nontoxic in office today.  He is hypertensive, though without cardiopulmonary symptoms, headache, abdominal pain.  States he has his blood pressure medications at home: Will take them when he returns.  H&P consistent with acute left-sided low back pain with sciatica.  No injury or inciting event: Radiography deferred at this time.  Patient given IM Solu-Medrol in office which he tolerated well.  Will add low-dose muscle relaxer as adjuvant to naproxen regimen which I encouraged him to continue.  Return precautions  discussed, patient verbalized understanding and is agreeable to plan. Final Clinical Impressions(s) / UC Diagnoses   Final diagnoses:  Acute left-sided low back pain with left-sided sciatica     Discharge Instructions     Recommend RICE: rest, ice, compression, elevation as needed for  pain.    Heat therapy (hot compress, warm wash red, hot showers, etc.) can help relax muscles and soothe muscle aches. Cold therapy (ice packs) can be used to help swelling both after injury and after prolonged use of areas of chronic pain/aches.  For pain: take naproxen, may add tylenol  May take muscle relaxer as needed for severe pain / spasm.  (This medication may cause you to become tired so it is important you do not drink alcohol or operate heavy machinery while on this medication.  Recommend your first dose to be taken before bedtime to monitor for side effects safely)  Go to ER for worsening pain, inability to walk, numbness, inability to hold bowel/bladder.    ED Prescriptions    Medication Sig Dispense Auth. Provider   cyclobenzaprine (FLEXERIL) 5 MG tablet Take 1 tablet (5 mg total) by mouth 2 (two) times daily as needed for up to 5 days for muscle spasms. 10 tablet Hall-Potvin, [place], PA-C     I have reviewed the PDMP during this encounter.   Hall-Potvin, [place], [place] 12/24/19 1122

## 2019-12-21 NOTE — ED Triage Notes (Signed)
Pt presents to [facility] for assessment of 2 weeks of left lower back pain radiating down the back of his left leg.   Patient states he had the same about a month ago, and it went away, but returned 2 weeks ago.  Patient states he has been seen on Televisit and was given Naproxen, which he says works for a little bit and then seems to intensify after each dose.

## 2020-01-19 ENCOUNTER — Telehealth: Payer: Self-pay | Admitting: Family Medicine

## 2020-01-19 NOTE — Telephone Encounter (Signed)
1) Medication(s) Requested (by name): -hydrochlorothiazide (HYDRODIURIL) 25 MG tablet  -indapamide (LOZOL) 1.25 MG tablet  -nebivolol (BYSTOLIC) 10 MG tablet  - 2) Pharmacy of Choice:  3) Special Requests:

## 2020-01-27 ENCOUNTER — Other Ambulatory Visit: Payer: Self-pay | Admitting: Family Medicine

## 2020-01-27 DIAGNOSIS — I10 Essential (primary) hypertension: Secondary | ICD-10-CM

## 2020-01-27 DIAGNOSIS — E785 Hyperlipidemia, unspecified: Secondary | ICD-10-CM

## 2020-02-05 ENCOUNTER — Ambulatory Visit: Payer: BC Managed Care – PPO | Attending: Family Medicine | Admitting: Physician Assistant

## 2020-02-05 ENCOUNTER — Other Ambulatory Visit: Payer: Self-pay

## 2020-02-05 DIAGNOSIS — K1370 Unspecified lesions of oral mucosa: Secondary | ICD-10-CM

## 2020-02-05 DIAGNOSIS — Z79899 Other long term (current) drug therapy: Secondary | ICD-10-CM

## 2020-02-05 DIAGNOSIS — Z716 Tobacco abuse counseling: Secondary | ICD-10-CM

## 2020-02-05 DIAGNOSIS — Z720 Tobacco use: Secondary | ICD-10-CM

## 2020-02-05 DIAGNOSIS — Z1211 Encounter for screening for malignant neoplasm of colon: Secondary | ICD-10-CM

## 2020-02-05 DIAGNOSIS — M5442 Lumbago with sciatica, left side: Secondary | ICD-10-CM

## 2020-02-05 DIAGNOSIS — Z125 Encounter for screening for malignant neoplasm of prostate: Secondary | ICD-10-CM

## 2020-02-05 DIAGNOSIS — I10 Essential (primary) hypertension: Secondary | ICD-10-CM

## 2020-02-05 DIAGNOSIS — E785 Hyperlipidemia, unspecified: Secondary | ICD-10-CM

## 2020-02-05 MED ORDER — METOPROLOL SUCCINATE ER 50 MG PO TB24: 50.0000 mg | ORAL_TABLET | Freq: Every day | ORAL | 1 refills | Status: DC

## 2020-02-05 MED ORDER — INDAPAMIDE 1.25 MG PO TABS: 1.2500 mg | ORAL_TABLET | Freq: Every day | ORAL | 0 refills | Status: DC

## 2020-02-05 MED ORDER — HYDROCHLOROTHIAZIDE 25 MG PO TABS: 25.0000 mg | ORAL_TABLET | Freq: Every day | ORAL | 6 refills | Status: DC

## 2020-02-05 MED ORDER — ROSUVASTATIN CALCIUM 10 MG PO TABS: 10.0000 mg | ORAL_TABLET | Freq: Every day | ORAL | 0 refills | Status: DC

## 2020-02-05 NOTE — Progress Notes (Signed)
Established Patient Office Visit  Subjective:  Patient ID: Austin Leonard, male    DOB: 07-20-1966  Age: 54 y.o. MRN: [id number]  CC:  Chief Complaint  Patient presents with  . Follow-up  . Back Pain  . Hypertension   HPI Austin Leonard presents for medication mgmt   Reports that his blood pressure has been well controlled until the last 2 weeks, states that he did run out of 2 of his medications.  Reports he is having difficulty affording Bystolic and would like to use a different medication.  Reports that he was seen in the emergency department for back pain, reports that he was given a muscle relaxer and naproxen along with a steroid shot.  Reports that the naproxen is not offering any relief, continues to have back pain that radiates down his left leg.  Has been working on stretching, states pain is improving but is still present.  Reports no issues with the use of Crestor, continues to work on low-fat diet. Reports that he will return to office next week for fasting labs   Patient denies any current dizziness or loss of balance associated with his prior cerebellar hemorrhage and no new symptoms.  Reports that he has reduced his cigarette smoking, is now smoking approximately 7 cigarettes a day, would like to restart his referral to the clinical pharmacist for further help in smoking cessation.  Reports that he would like to restart his referral for colonoscopy and restart his referral for otolaryngology to evaluate oral lesion.  Virtual Visit via Telephone Note  I connected with Austin Leonard on  by telephone and verified that I am speaking with the correct person using two identifiers.  Location: Patient: at [employer], newspaper Provider: [facility] clinic   I discussed the limitations, risks, security and privacy concerns of performing an evaluation and management service by telephone and the availability of in person appointments. I also discussed with the patient that there may be a  patient responsible charge related to this service. The patient expressed understanding and agreed to proceed.    Observations/Objective: Patient medical history was reviewed.  No physical exam was completed.      Past Medical History:  Diagnosis Date  . 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 Hypertension     Past Surgical History:  Procedure Laterality Date  . CRANIOTOMY N/A 02/27/2018   Procedure: CRANIECTOMY FOR SUBOCCIPTAL HEMORRHAGE;  Surgeon: Tia Alert, MD;  Location: [facility] OR;  Service: Neurosurgery;  Laterality: N/A;  . FACIAL LACERATION REPAIR Left 02/27/2018   Procedure: EYELID  LACERATION CLOSURE;  Surgeon: Flo Shanks, MD;  Location: [facility] OR;  Service: ENT;  Laterality: Left;    Family History  Problem Relation Age of Onset  . Hypertension Mother   . Diabetes Mother   . Hypertension Father   . CVA Father     Social History   Socioeconomic History  . Marital status: Married    Spouse name: Not on file  . Number of children: Not on file  . Years of education: Not on file  . Highest education level: Not on file  Occupational History  . Not on file  Tobacco Use  . Smoking status: Current Every Day Smoker    Packs/day: 0.50    Types: Cigarettes  . Smokeless tobacco: Never Used  Substance and Sexual Activity  . Alcohol use: Yes    Comment: occ  . Drug use: No  . Sexual activity: Yes  Other Topics  Concern  . Not on file  Social History Narrative  . Not on file   Social Determinants of Health   Financial Resource Strain:   . Difficulty of Paying Living Expenses:   Food Insecurity:   . Worried About Charity fundraiser in the Last Year:   . Arboriculturist in the Last Year:   Transportation Needs:   . Film/video editor (Medical):   Marland Kitchen Lack of Transportation (Non-Medical):   Physical Activity:   . Days of Exercise per Week:   . Minutes of Exercise per Session:   Stress:   . Feeling of Stress :   Social Connections:   . Frequency of  Communication with Friends and Family:   . Frequency of Social Gatherings with Friends and Family:   . Attends Religious Services:   . Active Member of Clubs or Organizations:   . Attends Archivist Meetings:   Marland Kitchen Marital Status:   Intimate Partner Violence:   . Fear of Current or Ex-Partner:   . Emotionally Abused:   Marland Kitchen Physically Abused:   . Sexually Abused:     Outpatient Medications Prior to Visit  Medication Sig Dispense Refill  . ibuprofen (ADVIL,MOTRIN) 200 MG tablet Take 600 mg by mouth every 6 (six) hours as needed (pain).    . nebivolol (BYSTOLIC) 10 MG tablet Take 1 tablet (10 mg total) by mouth daily. 60 tablet 6  . hydrochlorothiazide (HYDRODIURIL) 25 MG tablet Take 1 tablet (25 mg total) by mouth daily. 30 tablet 6  . indapamide (LOZOL) 1.25 MG tablet TAKE 1 TABLET (1.25 MG TOTAL) BY MOUTH DAILY. 30 tablet 0  . rosuvastatin (CRESTOR) 10 MG tablet TAKE 1 TABLET BY MOUTH EVERY DAY 30 tablet 0  . acetaminophen (TYLENOL) 500 MG tablet Take 1,000 mg by mouth every 8 (eight) hours as needed (pain).     No facility-administered medications prior to visit.    No Known Allergies   Objective:   There were no vitals taken for this visit. Wt Readings from Last 3 Encounters:  11/26/18 222 lb (100.7 kg)  08/09/18 220 lb 3.2 oz (99.9 kg)  02/28/18 229 lb 4.5 oz (104 kg)     Health Maintenance Due  Topic Date Due  . HIV Screening  Never done  . COLONOSCOPY  Never done    There are no preventive care reminders to display for this patient.  No results found for: TSH Lab Results  Component Value Date   WBC 9.4 09/23/2018   HGB 15.3 09/23/2018   HCT 48.9 09/23/2018   MCV 85 09/23/2018   PLT 262 09/23/2018   Lab Results  Component Value Date   NA 141 11/26/2018   K 4.4 11/26/2018   CO2 24 11/26/2018   GLUCOSE 90 11/26/2018   BUN 18 11/26/2018   CREATININE 1.21 11/26/2018   BILITOT 0.3 11/26/2018   ALKPHOS 91 11/26/2018   AST 22 11/26/2018   ALT 26  11/26/2018   PROT 7.1 11/26/2018   ALBUMIN 4.5 11/26/2018   CALCIUM 9.7 11/26/2018   ANIONGAP 10 03/14/2018   Lab Results  Component Value Date   CHOL 145 09/23/2018   Lab Results  Component Value Date   HDL 45 09/23/2018   Lab Results  Component Value Date   LDLCALC 82 09/23/2018   Lab Results  Component Value Date   TRIG 91 09/23/2018   Lab Results  Component Value Date   CHOLHDL 3.2 09/23/2018   Lab Results  Component Value Date   HGBA1C 5.4 08/09/2018      Assessment & Plan:   Problem List Items Addressed This Visit    None    Visit Diagnoses    Tobacco use    -  Primary   Relevant Orders   Ambulatory referral to ENT   Essential hypertension       Relevant Medications   hydrochlorothiazide (HYDRODIURIL) 25 MG tablet   indapamide (LOZOL) 1.25 MG tablet   rosuvastatin (CRESTOR) 10 MG tablet   metoprolol succinate (TOPROL-XL) 50 MG 24 hr tablet   Other Relevant Orders   CBC with Differential/Platelet   Comp. Metabolic Panel (12)   TSH   Hyperlipidemia, unspecified hyperlipidemia type       Relevant Medications   hydrochlorothiazide (HYDRODIURIL) 25 MG tablet   indapamide (LOZOL) 1.25 MG tablet   rosuvastatin (CRESTOR) 10 MG tablet   metoprolol succinate (TOPROL-XL) 50 MG 24 hr tablet   Other Relevant Orders   Comp. Metabolic Panel (12)   Lipid panel   Encounter for long-term (current) use of medications       Oral lesion       Relevant Orders   Ambulatory referral to ENT   Screening for colon cancer       Relevant Orders   Ambulatory referral to Gastroenterology   Acute left-sided low back pain with left-sided sciatica       Relevant Orders   Ambulatory referral to Physical Therapy   Prostate cancer screening       Relevant Orders   PSA, total and free      Meds ordered this encounter  Medications  . hydrochlorothiazide (HYDRODIURIL) 25 MG tablet    Sig: Take 1 tablet (25 mg total) by mouth daily.    Dispense:  30 tablet    Refill:  6      Order Specific Question:   Supervising Provider    Answer:   Shan Levans E [[id number]]  . indapamide (LOZOL) 1.25 MG tablet    Sig: Take 1 tablet (1.25 mg total) by mouth daily.    Dispense:  30 tablet    Refill:  0    Order Specific Question:   Supervising Provider    Answer:   Delford Field, PATRICK E [[id number]]  . rosuvastatin (CRESTOR) 10 MG tablet    Sig: Take 1 tablet (10 mg total) by mouth daily.    Dispense:  30 tablet    Refill:  0    Order Specific Question:   Supervising Provider    Answer:   Delford Field, PATRICK E [[id number]]  . metoprolol succinate (TOPROL-XL) 50 MG 24 hr tablet    Sig: Take 1 tablet (50 mg total) by mouth daily. Take with or immediately following a meal.    Dispense:  30 tablet    Refill:  1    Hold bystolic due to cost    Order Specific Question:   Supervising Provider    Answer:   Storm Frisk [[id number]]    Follow Up Instructions:  1. Essential hypertension Patient is concerned with affordability with Bystolic, trial of metoprolol 50 mg once daily, encourage patient to keep log of daily blood pressure readings, follow-up in 1 month with clinical pharmacist to review.  Continue working on low-sodium diet - hydrochlorothiazide (HYDRODIURIL) 25 MG tablet; Take 1 tablet (25 mg total) by mouth daily.  Dispense: 30 tablet; Refill: 6 - indapamide (LOZOL) 1.25 MG tablet; Take 1 tablet (1.25 mg total) by mouth daily.  Dispense: 30 tablet; Refill: 0 - CBC with Differential/Platelet; Future - Comp. Metabolic Panel (12); Future - TSH; Future  2. Hyperlipidemia, unspecified hyperlipidemia type Patient is overdue for fasting labs, encourage patient to return to clinic for fasting labs, patient understands and agrees. - rosuvastatin (CRESTOR) 10 MG tablet; Take 1 tablet (10 mg total) by mouth daily.  Dispense: 30 tablet; Refill: 0 - Comp. Metabolic Panel (12); Future - Lipid panel; Future  3. Tobacco use Patient education given on benefits of continuing to reduce tobacco  use. - Ambulatory referral to ENT  4. Encounter for long-term (current) use of medications Patient to return to clinic for fasting labs.  5. Oral lesion Resubmitted referral on patient's behalf - Ambulatory referral to ENT  6. Screening for colon cancer Resubmitted referral on patient's behalf - Ambulatory referral to Gastroenterology  7. Acute left-sided low back pain with left-sided sciatica Encourage patient to sign up for my chart, sent code via text, encourage patient to try Voltaren over-the-counter, continue with stretching, heat and ice, referral for physical therapy - Ambulatory referral to Physical Therapy  8. Prostate cancer screening  - PSA, total and free; Future    I discussed the assessment and treatment plan with the patient. The patient was provided an opportunity to ask questions and all were answered. The patient agreed with the plan and demonstrated an understanding of the instructions.   The patient was advised to call back or seek an in-person evaluation if the symptoms worsen or if the condition fails to improve as anticipated.  I provided 25 minutes of non-face-to-face time during this encounter.   Fayola Meckes S Mayers, PA-C  Follow-up: Return in about 2 days (around 02/07/2020) for Fasting labs.    Kasandra Knudsen Mayers, PA-C

## 2020-02-05 NOTE — Patient Instructions (Signed)
Good job on reducing the amount of cigarettes that you are smoking, keep up the good work.  The nurse will call you to set up your appointment to come in to do labs and appointment to see the clinical pharmacist to help you with smoking cessation.  For your back pain I recommend using Voltaren over-the-counter, I included some information about back stretches, I started a referral for physical therapy.  We are substituting your Bystolic for metoprolol.  Please continue to monitor your blood pressure and no eating changes, when you meet with clinical pharmacist you will show him your blood pressure log and he will help make any changes to the metoprolol better needed at that time.  I started a referral for you for gastroenterology for colon cancer screening.  I started a referral for you for evaluation of the oral lesion.   Sciatica Rehab Ask your health care provider which exercises are safe for you. Do exercises exactly as told by your health care provider and adjust them as directed. It is normal to feel mild stretching, pulling, tightness, or discomfort as you do these exercises. Stop right away if you feel sudden pain or your pain gets worse. Do not begin these exercises until told by your health care provider. Stretching and range-of-motion exercises These exercises warm up your muscles and joints and improve the movement and flexibility of your hips and back. These exercises also help to relieve pain, numbness, and tingling. Sciatic nerve glide 1. Sit in a chair with your head facing down toward your chest. Place your hands behind your back. Let your shoulders slump forward. 2. Slowly straighten one of your legs while you tilt your head back as if you are looking toward the ceiling. Only straighten your leg as far as you can without making your symptoms worse. 3. Hold this position for __________ seconds. 4. Slowly return to the starting position. 5. Repeat with your other leg. Repeat  __________ times. Complete this exercise __________ times a day. Knee to chest with hip adduction and internal rotation  1. Lie on your back on a firm surface with both legs straight. 2. Bend one of your knees and move it up toward your chest until you feel a gentle stretch in your lower back and buttock. Then, move your knee toward the shoulder that is on the opposite side from your leg. This is hip adduction and internal rotation. ? Hold your leg in this position by holding on to the front of your knee. 3. Hold this position for __________ seconds. 4. Slowly return to the starting position. 5. Repeat with your other leg. Repeat __________ times. Complete this exercise __________ times a day. Prone extension on elbows  1. Lie on your abdomen on a firm surface. A bed may be too soft for this exercise. 2. Prop yourself up on your elbows. 3. Use your arms to help lift your chest up until you feel a gentle stretch in your abdomen and your lower back. ? This will place some of your body weight on your elbows. If this is uncomfortable, try stacking pillows under your chest. ? Your hips should stay down, against the surface that you are lying on. Keep your hip and back muscles relaxed. 4. Hold this position for __________ seconds. 5. Slowly relax your upper body and return to the starting position. Repeat __________ times. Complete this exercise __________ times a day. Strengthening exercises These exercises build strength and endurance in your back. Endurance is the ability  to use your muscles for a long time, even after they get tired. Pelvic tilt This exercise strengthens the muscles that lie deep in the abdomen. 1. Lie on your back on a firm surface. Bend your knees and keep your feet flat on the floor. 2. Tense your abdominal muscles. Tip your pelvis up toward the ceiling and flatten your lower back into the floor. ? To help with this exercise, you may place a small towel under your lower  back and try to push your back into the towel. 3. Hold this position for __________ seconds. 4. Let your muscles relax completely before you repeat this exercise. Repeat __________ times. Complete this exercise __________ times a day. Alternating arm and leg raises  1. Get on your hands and knees on a firm surface. If you are on a hard floor, you may want to use padding, such as an exercise mat, to cushion your knees. 2. Line up your arms and legs. Your hands should be directly below your shoulders, and your knees should be directly below your hips. 3. Lift your left leg behind you. At the same time, raise your right arm and straighten it in front of you. ? Do not lift your leg higher than your hip. ? Do not lift your arm higher than your shoulder. ? Keep your abdominal and back muscles tight. ? Keep your hips facing the ground. ? Do not arch your back. ? Keep your balance carefully, and do not hold your breath. 4. Hold this position for __________ seconds. 5. Slowly return to the starting position. 6. Repeat with your right leg and your left arm. Repeat __________ times. Complete this exercise __________ times a day. Posture and body mechanics Good posture and healthy body mechanics can help to relieve stress in your body's tissues and joints. Body mechanics refers to the movements and positions of your body while you do your daily activities. Posture is part of body mechanics. Good posture means:  Your spine is in its natural S-curve position (neutral).  Your shoulders are pulled back slightly.  Your head is not tipped forward. Follow these guidelines to improve your posture and body mechanics in your everyday activities. Standing   When standing, keep your spine neutral and your feet about hip width apart. Keep a slight bend in your knees. Your ears, shoulders, and hips should line up.  When you do a task in which you stand in one place for a long time, place one foot up on a  stable object that is 2-4 inches (5-10 cm) high, such as a footstool. This helps keep your spine neutral. Sitting   When sitting, keep your spine neutral and keep your feet flat on the floor. Use a footrest, if necessary, and keep your thighs parallel to the floor. Avoid rounding your shoulders, and avoid tilting your head forward.  When working at a desk or a computer, keep your desk at a height where your hands are slightly lower than your elbows. Slide your chair under your desk so you are close enough to maintain good posture.  When working at a computer, place your monitor at a height where you are looking straight ahead and you do not have to tilt your head forward or downward to look at the screen. Resting  When lying down and resting, avoid positions that are most painful for you.  If you have pain with activities such as sitting, bending, stooping, or squatting, lie in a position in which your  body does not bend very much. For example, avoid curling up on your side with your arms and knees near your chest (fetal position).  If you have pain with activities such as standing for a long time or reaching with your arms, lie with your spine in a neutral position and bend your knees slightly. Try the following positions: ? Lying on your side with a pillow between your knees. ? Lying on your back with a pillow under your knees. Lifting   When lifting objects, keep your feet at least shoulder width apart and tighten your abdominal muscles.  Bend your knees and hips and keep your spine neutral. It is important to lift using the strength of your legs, not your back. Do not lock your knees straight out.  Always ask for help to lift heavy or awkward objects. This information is not intended to replace advice given to you by your health care provider. Make sure you discuss any questions you have with your health care provider. Document Revised: 02/28/2019 Document Reviewed:  11/28/2018 Elsevier Patient Education  2020 [employer].

## 2020-02-05 NOTE — Progress Notes (Signed)
Patient verified DOB Patient has taken medication today. Patient has only had coffee today. Patient complains of pain in the back with sciatic pain. Scaled currently 3.

## 2020-02-20 ENCOUNTER — Other Ambulatory Visit: Payer: Self-pay | Admitting: Family Medicine

## 2020-02-20 DIAGNOSIS — E785 Hyperlipidemia, unspecified: Secondary | ICD-10-CM

## 2020-02-26 ENCOUNTER — Other Ambulatory Visit: Payer: Self-pay

## 2020-02-26 ENCOUNTER — Ambulatory Visit: Payer: BC Managed Care – PPO | Attending: Family Medicine

## 2020-03-08 ENCOUNTER — Other Ambulatory Visit: Payer: Self-pay | Admitting: Family Medicine

## 2020-03-08 DIAGNOSIS — E785 Hyperlipidemia, unspecified: Secondary | ICD-10-CM

## 2020-03-09 ENCOUNTER — Other Ambulatory Visit: Payer: Self-pay | Admitting: Pharmacist

## 2020-03-09 DIAGNOSIS — E785 Hyperlipidemia, unspecified: Secondary | ICD-10-CM

## 2020-03-09 MED ORDER — ROSUVASTATIN CALCIUM 10 MG PO TABS: 10.0000 mg | ORAL_TABLET | Freq: Every day | ORAL | 0 refills | Status: DC

## 2020-03-10 ENCOUNTER — Ambulatory Visit (INDEPENDENT_AMBULATORY_CARE_PROVIDER_SITE_OTHER): Payer: BC Managed Care – PPO | Admitting: Otolaryngology

## 2020-03-11 ENCOUNTER — Ambulatory Visit: Payer: BC Managed Care – PPO | Attending: Family Medicine

## 2020-03-11 ENCOUNTER — Other Ambulatory Visit: Payer: Self-pay

## 2020-03-11 DIAGNOSIS — M9901 Segmental and somatic dysfunction of cervical region: Secondary | ICD-10-CM | POA: Diagnosis not present

## 2020-03-11 DIAGNOSIS — M9905 Segmental and somatic dysfunction of pelvic region: Secondary | ICD-10-CM | POA: Diagnosis not present

## 2020-03-11 DIAGNOSIS — M9903 Segmental and somatic dysfunction of lumbar region: Secondary | ICD-10-CM | POA: Diagnosis not present

## 2020-03-11 DIAGNOSIS — E785 Hyperlipidemia, unspecified: Secondary | ICD-10-CM | POA: Diagnosis not present

## 2020-03-11 DIAGNOSIS — Z125 Encounter for screening for malignant neoplasm of prostate: Secondary | ICD-10-CM | POA: Diagnosis not present

## 2020-03-11 DIAGNOSIS — M5432 Sciatica, left side: Secondary | ICD-10-CM | POA: Diagnosis not present

## 2020-03-11 DIAGNOSIS — I10 Essential (primary) hypertension: Secondary | ICD-10-CM | POA: Diagnosis not present

## 2020-03-12 LAB — CBC WITH DIFFERENTIAL/PLATELET
Basophils Absolute: 0.1 10*3/uL (ref 0.0–0.2)
Basos: 1 %
EOS (ABSOLUTE): 0.7 10*3/uL — ABNORMAL HIGH (ref 0.0–0.4)
Eos: 7 %
Hematocrit: 49.4 % (ref 37.5–51.0)
Hemoglobin: 16.4 g/dL (ref 13.0–17.7)
Immature Grans (Abs): 0 10*3/uL (ref 0.0–0.1)
Immature Granulocytes: 0 %
Lymphocytes Absolute: 2.5 10*3/uL (ref 0.7–3.1)
Lymphs: 25 %
MCH: 28.6 pg (ref 26.6–33.0)
MCHC: 33.2 g/dL (ref 31.5–35.7)
MCV: 86 fL (ref 79–97)
Monocytes Absolute: 0.9 10*3/uL (ref 0.1–0.9)
Monocytes: 9 %
Neutrophils Absolute: 5.6 10*3/uL (ref 1.4–7.0)
Neutrophils: 58 %
Platelets: 279 10*3/uL (ref 150–450)
RBC: 5.73 x10E6/uL (ref 4.14–5.80)
RDW: 13.2 % (ref 11.6–15.4)
WBC: 9.7 10*3/uL (ref 3.4–10.8)

## 2020-03-12 LAB — COMP. METABOLIC PANEL (12)
AST: 22 IU/L (ref 0–40)
Albumin/Globulin Ratio: 1.4 (ref 1.2–2.2)
Albumin: 4.5 g/dL (ref 3.8–4.9)
Alkaline Phosphatase: 99 IU/L (ref 39–117)
BUN/Creatinine Ratio: 13 (ref 9–20)
BUN: 17 mg/dL (ref 6–24)
Bilirubin Total: 0.5 mg/dL (ref 0.0–1.2)
Calcium: 9.9 mg/dL (ref 8.7–10.2)
Chloride: 98 mmol/L (ref 96–106)
Creatinine, Ser: 1.26 mg/dL (ref 0.76–1.27)
GFR calc Af Amer: 76 mL/min/{1.73_m2} (ref >59)
GFR calc non Af Amer: 66 mL/min/{1.73_m2} (ref >59)
Globulin, Total: 3.3 g/dL (ref 1.5–4.5)
Glucose: 88 mg/dL (ref 65–99)
Potassium: 4.8 mmol/L (ref 3.5–5.2)
Sodium: 138 mmol/L (ref 134–144)
Total Protein: 7.8 g/dL (ref 6.0–8.5)

## 2020-03-12 LAB — PSA, TOTAL AND FREE
PSA, Free Pct: 25 %
PSA, Free: 0.15 ng/mL
Prostate Specific Ag, Serum: 0.6 ng/mL (ref 0.0–4.0)

## 2020-03-12 LAB — LIPID PANEL
Chol/HDL Ratio: 2.7 ratio (ref 0.0–5.0)
Cholesterol, Total: 151 mg/dL (ref 100–199)
HDL: 56 mg/dL (ref >39)
LDL Chol Calc (NIH): 74 mg/dL (ref 0–99)
Triglycerides: 118 mg/dL (ref 0–149)
VLDL Cholesterol Cal: 21 mg/dL (ref 5–40)

## 2020-03-12 LAB — TSH: TSH: 0.629 u[IU]/mL (ref 0.450–4.500)

## 2020-03-15 ENCOUNTER — Telehealth: Payer: Self-pay | Admitting: *Deleted

## 2020-03-15 NOTE — Telephone Encounter (Signed)
-----   Message from Roney Jaffe, [place] sent at 03/13/2020  9:18 AM EDT ----- Please call patient and let him know that his labs were WNL. Thanks!

## 2020-04-14 ENCOUNTER — Telehealth: Payer: Self-pay | Admitting: Family Medicine

## 2020-04-14 MED ORDER — METOPROLOL SUCCINATE ER 50 MG PO TB24: 50.0000 mg | ORAL_TABLET | Freq: Every day | ORAL | 0 refills | Status: DC

## 2020-04-14 NOTE — Telephone Encounter (Signed)
Rx sent 

## 2020-04-14 NOTE — Telephone Encounter (Signed)
Patient called and requested for listed medication to be refilled and sent to [id number] - [place], [place] - [address]  [address] [place]  [address]  metoprolol succinate (TOPROL-XL) 50 MG 24 hr tablet [[id number]]

## 2020-07-02 ENCOUNTER — Other Ambulatory Visit: Payer: Self-pay | Admitting: Family Medicine

## 2020-08-27 ENCOUNTER — Other Ambulatory Visit: Payer: Self-pay | Admitting: Family Medicine

## 2020-08-27 NOTE — Telephone Encounter (Signed)
Courtesy refill  

## 2020-09-19 ENCOUNTER — Other Ambulatory Visit: Payer: Self-pay | Admitting: Family Medicine

## 2020-09-19 NOTE — Telephone Encounter (Signed)
Attempted to call pt but unable to LM "mailbox full." 30 day courtesy refill previously given on 08/27/20. Refusing prescription.

## 2020-10-19 ENCOUNTER — Other Ambulatory Visit: Payer: Self-pay | Admitting: Family Medicine

## 2020-10-19 DIAGNOSIS — I10 Essential (primary) hypertension: Secondary | ICD-10-CM

## 2020-10-19 DIAGNOSIS — E785 Hyperlipidemia, unspecified: Secondary | ICD-10-CM

## 2020-10-19 NOTE — Telephone Encounter (Signed)
Medication Refill - Medication: Hydrochlorothiazide, metoprolol, rosuvastatin   Has the patient contacted their pharmacy? Yes.   (Agent: If no, request that the patient contact the pharmacy for the refill.) (Agent: If yes, when and what did the pharmacy advise?)  Preferred Pharmacy (with phone number or street name):  [id number] [place], [place] - [address]  [address] [address]  Phone: [phone] Fax: [phone]  Hours: Not open 24 hours     Agent: Please be advised that RX refills may take up to 3 business days. We ask that you follow-up with your pharmacy.

## 2020-10-19 NOTE — Telephone Encounter (Signed)
Requested medication (s) are due for refill today: no  Requested medication (s) are on the active medication list: yes   Future visit scheduled: no  Notes to clinic:  vm left for patient to callback for appt Overdue for follow up    Requested Prescriptions  Pending Prescriptions Disp Refills   metoprolol succinate (TOPROL-XL) 50 MG 24 hr tablet 30 tablet 0    Sig: Take 1 tablet (50 mg total) by mouth daily. Take with or immediately following a meal.      Cardiovascular:  Beta Blockers Failed - 10/19/2020  3:09 PM      Failed - Last BP in normal range    BP Readings from Last 1 Encounters:  12/21/19 (!) 172/108          Failed - Valid encounter within last 6 months    Recent Outpatient Visits           8 months ago Tobacco use   [facility] [address] [facility] [place], [place], [place]   1 year ago Essential hypertension   [facility] [place], [place], [place]   1 year ago Essential hypertension   [facility] [place], [place], [place]   2 years ago History of cerebellar hemorrhage   [facility] [place], [place], MD              Passed - Last Heart Rate in normal range    Pulse Readings from Last 1 Encounters:  12/21/19 89            hydrochlorothiazide (HYDRODIURIL) 25 MG tablet 30 tablet 6    Sig: Take 1 tablet (25 mg total) by mouth daily.      Cardiovascular: Diuretics - Thiazide Failed - 10/19/2020  3:09 PM      Failed - Last BP in normal range    BP Readings from Last 1 Encounters:  12/21/19 (!) 172/108          Failed - Valid encounter within last 6 months    Recent Outpatient Visits           8 months ago Tobacco use   [place] [address] [facility] [place], Cari S, PA-C   1 year ago Essential hypertension    [facility] [place], [place], [place]   1 year ago Essential hypertension    [facility] [place], [place], [place]    2 years ago History of cerebellar hemorrhage    [facility] [place], [place], MD              Passed - Ca in normal range and within 360 days    Calcium  Date Value Ref Range Status  03/11/2020 9.9 8.7 - 10.2 mg/dL Final   Calcium, Ion  Date Value Ref Range Status  02/27/2018 1.15 1.15 - 1.40 mmol/L Final          Passed - Cr in normal range and within 360 days    Creatinine, Ser  Date Value Ref Range Status  03/11/2020 1.26 0.76 - 1.27 mg/dL Final          Passed - K in normal range and within 360 days    Potassium  Date Value Ref Range Status  03/11/2020 4.8 3.5 - 5.2 mmol/L Final          Passed - Na in normal range and within 360 days  Sodium  Date Value Ref Range Status  03/11/2020 138 134 - 144 mmol/L Final            rosuvastatin (CRESTOR) 10 MG tablet 90 tablet 0    Sig: Take 1 tablet (10 mg total) by mouth daily.      Cardiovascular:  Antilipid - Statins Failed - 10/19/2020  3:09 PM      Failed - LDL in normal range and within 360 days    LDL Chol Calc (NIH)  Date Value Ref Range Status  03/11/2020 74 0 - 99 mg/dL Final          Passed - Total Cholesterol in normal range and within 360 days    Cholesterol, Total  Date Value Ref Range Status  03/11/2020 151 100 - 199 mg/dL Final          Passed - HDL in normal range and within 360 days    HDL  Date Value Ref Range Status  03/11/2020 56 >39 mg/dL Final          Passed - Triglycerides in normal range and within 360 days    Triglycerides  Date Value Ref Range Status  03/11/2020 118 0 - 149 mg/dL Final          Passed - Patient is not pregnant      Passed - Valid encounter within last 12 months    Recent Outpatient Visits           8 months ago Tobacco use   [place] [address] [facility] [place], [place], PA-C   1 year ago Essential hypertension   [facility] [place], [place], MD   1 year ago Essential hypertension    [facility] [place], [place], [place]   2 years ago History of cerebellar hemorrhage    [facility] [place], [place], [place]

## 2020-10-26 ENCOUNTER — Other Ambulatory Visit: Payer: Self-pay | Admitting: Family Medicine

## 2020-10-26 NOTE — Telephone Encounter (Signed)
Requested medication (s) are due for refill today: yes   Requested medication (s) are on the active medication list: yes  Last refill:  08/27/20 #30 0 refills  Future visit scheduled: no   Notes to clinic:  attempted to call patient no answer, left voicemail to call clinic back and scheduled appt . Last refill courtesy refill. Do you want to have a 2nd courtesy refill?     Requested Prescriptions  Pending Prescriptions Disp Refills   metoprolol succinate (TOPROL-XL) 50 MG 24 hr tablet [Pharmacy Med Name: METOPROLOL SUCC ER 50 MG TAB] 30 tablet 0    Sig: TAKE 1 TABLET BY MOUTH DAILY. TAKE WITH OR IMMEDIATELY FOLLOWING A MEAL.      Cardiovascular:  Beta Blockers Failed - 10/26/2020  3:59 PM      Failed - Last BP in normal range    BP Readings from Last 1 Encounters:  12/21/19 (!) 172/108          Failed - Valid encounter within last 6 months    Recent Outpatient Visits           8 months ago Tobacco use   [facility] [place] [address] [facility] [place], [place], [place]   1 year ago Essential hypertension   [facility] [place], [place], [place]   1 year ago Essential hypertension   [facility] [place], [place], [place]   2 years ago History of cerebellar hemorrhage   [facility] [place], [place], [place]              Passed - Last Heart Rate in normal range    Pulse Readings from Last 1 Encounters:  12/21/19 89

## 2020-11-19 ENCOUNTER — Other Ambulatory Visit: Payer: Self-pay | Admitting: Critical Care Medicine

## 2020-11-24 ENCOUNTER — Other Ambulatory Visit: Payer: Self-pay | Admitting: Physician Assistant

## 2020-11-24 DIAGNOSIS — I10 Essential (primary) hypertension: Secondary | ICD-10-CM

## 2020-11-25 NOTE — Telephone Encounter (Signed)
Copied from CRM [id number]. Topic: Quick Communication - See Telephone Encounter >> Nov 23, 2020 11:29 AM Aretta Nip wrote: CRM for notification. See Telephone encounter for: 11/23/20.metoprolol succinate (TOPROL-XL) 50 MG 24 hr tablet Pt has made an appt as Dr Delford Field request when asked for refill. Pt made appt with 1st available as Delford Field into Feb. Pt appt not till 1/26. Is requesting Med be refilled as he took soonest apt. Partial refill ok FU at [phone] still ok

## 2020-12-10 ENCOUNTER — Other Ambulatory Visit: Payer: Self-pay | Admitting: Pharmacist

## 2020-12-10 MED ORDER — METOPROLOL SUCCINATE ER 50 MG PO TB24: ORAL_TABLET | ORAL | 0 refills | Status: DC

## 2020-12-15 ENCOUNTER — Other Ambulatory Visit: Payer: Self-pay | Admitting: Physician Assistant

## 2020-12-15 ENCOUNTER — Encounter: Payer: Self-pay | Admitting: Physician Assistant

## 2020-12-15 ENCOUNTER — Other Ambulatory Visit: Payer: Self-pay

## 2020-12-15 ENCOUNTER — Ambulatory Visit: Payer: BC Managed Care – PPO | Attending: Physician Assistant | Admitting: Physician Assistant

## 2020-12-15 VITALS — BP 148/96 | HR 104 | Temp 98.4°F | Resp 16 | Ht 65.0 in | Wt 242.0 lb

## 2020-12-15 DIAGNOSIS — E785 Hyperlipidemia, unspecified: Secondary | ICD-10-CM | POA: Diagnosis not present

## 2020-12-15 DIAGNOSIS — H538 Other visual disturbances: Secondary | ICD-10-CM | POA: Diagnosis not present

## 2020-12-15 DIAGNOSIS — N529 Male erectile dysfunction, unspecified: Secondary | ICD-10-CM

## 2020-12-15 DIAGNOSIS — I160 Hypertensive urgency: Secondary | ICD-10-CM | POA: Diagnosis not present

## 2020-12-15 DIAGNOSIS — I10 Essential (primary) hypertension: Secondary | ICD-10-CM

## 2020-12-15 DIAGNOSIS — R739 Hyperglycemia, unspecified: Secondary | ICD-10-CM

## 2020-12-15 LAB — POCT GLYCOSYLATED HEMOGLOBIN (HGB A1C): Hemoglobin A1C: 5.5 % (ref 4.0–5.6)

## 2020-12-15 LAB — GLUCOSE, POCT (MANUAL RESULT ENTRY): POC Glucose: 104 mg/dl — AB (ref 70–99)

## 2020-12-15 MED ORDER — HYDROCHLOROTHIAZIDE 25 MG PO TABS: 25.0000 mg | ORAL_TABLET | Freq: Every day | ORAL | 6 refills | Status: DC

## 2020-12-15 MED ORDER — INDAPAMIDE 1.25 MG PO TABS: 1.2500 mg | ORAL_TABLET | Freq: Every day | ORAL | 4 refills | Status: DC

## 2020-12-15 MED ORDER — CARVEDILOL 12.5 MG PO TABS: 12.5000 mg | ORAL_TABLET | Freq: Two times a day (BID) | ORAL | 3 refills | Status: DC

## 2020-12-15 MED ORDER — ROSUVASTATIN CALCIUM 10 MG PO TABS: 10.0000 mg | ORAL_TABLET | Freq: Every day | ORAL | 0 refills | Status: DC

## 2020-12-15 MED ORDER — METOPROLOL SUCCINATE ER 50 MG PO TB24: ORAL_TABLET | ORAL | 4 refills | Status: DC

## 2020-12-15 MED ORDER — CLONIDINE HCL 0.2 MG PO TABS: 0.2000 mg | ORAL_TABLET | Freq: Once | ORAL | Status: DC

## 2020-12-15 MED ORDER — CARVEDILOL 12.5 MG PO TABS
12.5000 mg | ORAL_TABLET | Freq: Two times a day (BID) | ORAL | 3 refills | Status: DC
Start: 2020-12-15 — End: 2020-12-15

## 2020-12-15 MED FILL — CARVEDILOL 12.5 MG TABLET: 12.5 | 30 days supply | Qty: 60 | Fill #0

## 2020-12-15 MED FILL — HYDROCHLOROTHIAZIDE 25 MG T: 25 | 30 days supply | Qty: 30 | Fill #0

## 2020-12-15 NOTE — Progress Notes (Signed)
Patient ID: Austin Leonard, male   DOB: 07/14/1966, 55 y.o.   MRN: [id number]   Austin Leonard, is a 55 y.o. male  WER:[id number]  [id number]:[id number]  DOB - May 16, 1966  Subjective:  Chief Complaint and HPI: Austin Leonard is a 55 y.o. male here today out of all meds.  No CP.  Occasional HA but nothing new or severe.  He has noticed his reading vision has not been as good lately.    ROS:   Constitutional:  No f/c, No night sweats, No unexplained weight loss. EENT:   No hearing changes. No mouth, throat, or ear problems.  Respiratory: No cough, No SOB Cardiac: No CP, no palpitations GI:  No abd pain, No N/V/D. GU: No Urinary s/sx Musculoskeletal: No joint pain Neuro: occasional headache, no dizziness, no motor weakness.  Skin: No rash Endocrine:  No polydipsia. No polyuria.  Psych: Denies SI/HI  No problems updated.  ALLERGIES: No Known Allergies  PAST MEDICAL HISTORY: Past Medical History:  Diagnosis Date  . 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 Hypertension     MEDICATIONS AT HOME: Prior to Admission medications   Medication Sig Start Date End Date Taking? Authorizing Provider  carvedilol (COREG) 12.5 MG tablet Take 1 tablet (12.5 mg total) by mouth 2 (two) times daily with a meal. 12/15/20  Yes Yanessa Hocevar M, PA-C  acetaminophen (TYLENOL) 500 MG tablet Take 1,000 mg by mouth every 8 (eight) hours as needed (pain). Patient not taking: Reported on 12/15/2020    [provider]  hydrochlorothiazide (HYDRODIURIL) 25 MG tablet Take 1 tablet (25 mg total) by mouth daily. 12/15/20   Anders Simmonds, PA-C  ibuprofen (ADVIL,MOTRIN) 200 MG tablet Take 600 mg by mouth every 6 (six) hours as needed (pain). Patient not taking: Reported on 12/15/2020    [provider]  rosuvastatin (CRESTOR) 10 MG tablet Take 1 tablet (10 mg total) by mouth daily. 12/15/20   Anders Simmonds, PA-C     Objective:  EXAM:   Vitals:   12/15/20 1341  BP: (!) 177/138  Pulse: (!) 112   Resp: 16  Temp: 98.4 F (36.9 C)  SpO2: 98%  Weight: 242 lb (109.8 kg)  Height: 5\' 5"  (1.651 m)   After clonidine BP=148/96 and pulse 104 General appearance : A&OX3. NAD. Non-toxic-appearing HEENT: Atraumatic and Normocephalic.  PERRLA. EOM intact.  Fundi exam benign but limited. Mouth-MMM, post pharynx WNL w/o erythema, No PND. Neck: supple, no JVD. No cervical lymphadenopathy. No thyromegaly Chest/Lungs:  Breathing-non-labored, Good air entry bilaterally, breath sounds normal without rales, rhonchi, or wheezing  CVS: S1 S2 regular, no murmurs, gallops, rubs  Extremities: Bilateral Lower Ext shows no edema, both legs are warm to touch with = pulse throughout Neurology:  CN II-XII grossly intact, Non focal.   Psych:  TP linear. J/I WNL. Normal speech. Appropriate eye contact and affect.  Skin:  No Rash  Data Review Lab Results  Component Value Date   HGBA1C 5.4 08/09/2018     Assessment & Plan   1. Hypertensive urgency Reviewed case with Dr 08/11/2018 - cloNIDine (CATAPRES) tablet 0.2 mg - CBC with Differential/Platelet  2. Essential hypertension STOP INDAPAMIDE AND METOPROLOL.  Discussed and reviewed patient with Dr Alvis Lemmings - hydrochlorothiazide (HYDRODIURIL) 25 MG tablet; Take 1 tablet (25 mg total) by mouth daily.  Dispense: 30 tablet; Refill: 6 - Comprehensive metabolic panel - CBC with Differential/Platelet - carvedilol (COREG) 12.5 MG tablet; Take 1 tablet (12.5 mg total) by mouth 2 (  two) times daily with a meal.  Dispense: 60 tablet; Refill: 3  3. Hyperlipidemia, unspecified hyperlipidemia type - rosuvastatin (CRESTOR) 10 MG tablet; Take 1 tablet (10 mg total) by mouth daily.  Dispense: 90 tablet; Refill: 0  4. Erectile dysfunction, unspecified erectile dysfunction type Not a candidate for ED meds with current BP.  (Discussed not to take a friends meds as this is a common practice in men-morbidity and mortality possibilities reviewed) - Thyroid Panel With TSH  5.  Blurry vision A1C=5.5 today - Ambulatory referral to Ophthalmology - POCT glucose (manual entry) - POCT glycosylated hemoglobin (Hb A1C)  6. Hyperglycemia I have had a lengthy discussion and provided education about insulin resistance and the intake of too much sugar/refined carbohydrates.  I have advised the patient to work at a goal of eliminating sugary drinks, candy, desserts, sweets, refined sugars, processed foods, and white carbohydrates.  The patient expresses understanding.  Not diabetic.    Patient have been counseled extensively about nutrition and exercise  Return for [facility] in 3 weeks assign to Dr Alvis Lemmings in 2 months.  The patient was given clear instructions to go to ER or return to medical center if symptoms don't improve, worsen or new problems develop. The patient verbalized understanding. The patient was told to call to get lab results if they haven't heard anything in the next week.     Georgian Co, PA-C [facility] [place], [place] [phone]   12/15/2020, 2:13 PM

## 2020-12-15 NOTE — Patient Instructions (Signed)
STOP INDAPAMIDE AND METOPROLOL  YOU WILL BE TAKING CARVEDILOL AND HYDROCHLOROTHIAZIDE ONLY FOR NOW.    CHECK BLOOD PRESSURE DAILY AND RECORD AND BRING TO NEXT VISIT.

## 2020-12-16 LAB — COMPREHENSIVE METABOLIC PANEL
ALT: 23 IU/L (ref 0–44)
AST: 22 IU/L (ref 0–40)
Albumin/Globulin Ratio: 1.6 (ref 1.2–2.2)
Albumin: 4.8 g/dL (ref 3.8–4.9)
Alkaline Phosphatase: 96 IU/L (ref 44–121)
BUN/Creatinine Ratio: 12 (ref 9–20)
BUN: 16 mg/dL (ref 6–24)
Bilirubin Total: 0.4 mg/dL (ref 0.0–1.2)
CO2: 26 mmol/L (ref 20–29)
Calcium: 9.8 mg/dL (ref 8.7–10.2)
Chloride: 99 mmol/L (ref 96–106)
Creatinine, Ser: 1.36 mg/dL — ABNORMAL HIGH (ref 0.76–1.27)
GFR calc Af Amer: 69 mL/min/{1.73_m2} (ref >59)
GFR calc non Af Amer: 59 mL/min/{1.73_m2} — ABNORMAL LOW (ref >59)
Globulin, Total: 3 g/dL (ref 1.5–4.5)
Glucose: 88 mg/dL (ref 65–99)
Potassium: 4.6 mmol/L (ref 3.5–5.2)
Sodium: 140 mmol/L (ref 134–144)
Total Protein: 7.8 g/dL (ref 6.0–8.5)

## 2020-12-16 LAB — CBC WITH DIFFERENTIAL/PLATELET
Basophils Absolute: 0.1 10*3/uL (ref 0.0–0.2)
Basos: 1 %
EOS (ABSOLUTE): 0.6 10*3/uL — ABNORMAL HIGH (ref 0.0–0.4)
Eos: 6 %
Hematocrit: 51.7 % — ABNORMAL HIGH (ref 37.5–51.0)
Hemoglobin: 16.7 g/dL (ref 13.0–17.7)
Immature Grans (Abs): 0 10*3/uL (ref 0.0–0.1)
Immature Granulocytes: 0 %
Lymphocytes Absolute: 2.2 10*3/uL (ref 0.7–3.1)
Lymphs: 22 %
MCH: 27.2 pg (ref 26.6–33.0)
MCHC: 32.3 g/dL (ref 31.5–35.7)
MCV: 84 fL (ref 79–97)
Monocytes Absolute: 0.9 10*3/uL (ref 0.1–0.9)
Monocytes: 9 %
Neutrophils Absolute: 6.3 10*3/uL (ref 1.4–7.0)
Neutrophils: 62 %
Platelets: 240 10*3/uL (ref 150–450)
RBC: 6.15 x10E6/uL — ABNORMAL HIGH (ref 4.14–5.80)
RDW: 14.3 % (ref 11.6–15.4)
WBC: 10.2 10*3/uL (ref 3.4–10.8)

## 2020-12-16 LAB — THYROID PANEL WITH TSH
Free Thyroxine Index: 1.7 (ref 1.2–4.9)
T3 Uptake Ratio: 24 % (ref 24–39)
T4, Total: 7.1 ug/dL (ref 4.5–12.0)
TSH: 0.696 u[IU]/mL (ref 0.450–4.500)

## 2020-12-17 ENCOUNTER — Encounter: Payer: Self-pay | Admitting: *Deleted

## 2020-12-28 DIAGNOSIS — H2513 Age-related nuclear cataract, bilateral: Secondary | ICD-10-CM | POA: Diagnosis not present

## 2020-12-28 DIAGNOSIS — H538 Other visual disturbances: Secondary | ICD-10-CM | POA: Diagnosis not present

## 2020-12-28 DIAGNOSIS — H5010 Unspecified exotropia: Secondary | ICD-10-CM | POA: Diagnosis not present

## 2020-12-28 DIAGNOSIS — H5021 Vertical strabismus, right eye: Secondary | ICD-10-CM | POA: Diagnosis not present

## 2021-01-05 ENCOUNTER — Ambulatory Visit: Payer: BC Managed Care – PPO | Attending: Family Medicine | Admitting: Pharmacist

## 2021-01-05 ENCOUNTER — Other Ambulatory Visit: Payer: Self-pay

## 2021-01-05 ENCOUNTER — Encounter: Payer: Self-pay | Admitting: Pharmacist

## 2021-01-05 VITALS — BP 177/112

## 2021-01-05 DIAGNOSIS — I160 Hypertensive urgency: Secondary | ICD-10-CM | POA: Diagnosis not present

## 2021-01-05 MED ORDER — AMLODIPINE BESYLATE 5 MG PO TABS: 5.0000 mg | ORAL_TABLET | Freq: Every day | ORAL | 0 refills | Status: DC

## 2021-01-05 NOTE — Progress Notes (Signed)
PCP: assigned to Dr. Alvis Lemmings, appt on 02/14/2021  S:    Patient presents to the clinic for hypertension evaluation, counseling, and management. Patient was referred and last seen by Georgian Co on 12/15/2020.   Medication adherence reported, however he has only been taking carvedilol once daily because he did not know it was a twice daily medication. He denies HA, chest pain, SOB, and lightheadedness or dizziness greater than his normal. He endorses fatigue and blurry vision. Since last visit he has made significant lifestyle changes by cutting out fast food and walking 2-3 miles every other day. Of note, he reports seeing the ophthalmologist yesterday. At that visit, he reports the dr told him his blurry vision is likely due to uncontrolled HTN.   Current BP Medications include:  HCTZ 25 mg daily, carvedilol 12.5 mg BID (pt reports taking once daily)  Dietary habits include: - in the past 2 weeks he has cut out fast food - drinks 2-3 mixed alcoholic beverages daily - 1/2 cup of coffee daily  Exercise habits include: - walks 2-3 miles every other day  Family / Social history: - current smoker (15 cigarettes/day) - mother: HTN, DM - father: HTN, stroke  O:  Vitals:   01/05/21 1040  BP: (!) 177/112   Home BP readings:  Systolics: 150-180 Diastolics: 86-117 - pt reported that home brachial BP cuff is tight and sometimes pops off his arm when inflating  Last 3 Office BP readings: BP Readings from Last 3 Encounters:  01/05/21 (!) 177/112  12/15/20 (!) 148/96  12/21/19 (!) 172/108   BMET    Component Value Date/Time   NA 140 12/15/2020 1518   K 4.6 12/15/2020 1518   CL 99 12/15/2020 1518   CO2 26 12/15/2020 1518   GLUCOSE 88 12/15/2020 1518   GLUCOSE 96 03/14/2018 1344   BUN 16 12/15/2020 1518   CREATININE 1.36 (H) 12/15/2020 1518   CALCIUM 9.8 12/15/2020 1518   GFRNONAA 59 (L) 12/15/2020 1518   GFRAA 69 12/15/2020 1518   Renal function: CrCl cannot be calculated  (Unknown ideal weight.).  Clinical ASCVD: NO The 10-year ASCVD risk score Denman George DC Jr., et al., 2013) is: 25.1%   Values used to calculate the score:     Age: 55 years     Sex: Male     Is Non-Hispanic African American: Yes     Diabetic: No     Tobacco smoker: Yes     Systolic Blood Pressure: 177 mmHg     Is BP treated: Yes     HDL Cholesterol: 56 mg/dL     Total Cholesterol: 151 mg/dL  A/P: Hypertension longstanding currently uncontrolled on current medications. BP Goal = < 130/80 mmHg. Medication adherence reported, however he has only been taking carvedilol once daily. Home BP cuff may be too small resulting in some falsely elevated BP readings. Today, pt reports continuous fatigue and blurry vision and has hypertensive urgency for the second office visit in a row. Therefore, he needs additional drug therapy to control BP. - Start amlodipine 5 mg daily - Continue HCTZ 25 mg daily - Continue carvedilol 12.5 mg BID (counseled pt on twice daily administration) - Advised pt to obtain a home BP cuff in adult large size  - Counseled on lifestyle modifications for blood pressure control including reduced dietary sodium, increased exercise, adequate sleep.   Hyperlipidemia - counseled pt on taking rosuvastatin in the evening instead of the morning as he reported  Results reviewed and  written information provided.   Total time in face-to-face counseling 30 minutes.   F/U pharmacist visit in 1 month.    Adam Phenix, PharmD Candidate [employer]  Butch Penny, PharmD, [place], CPP Clinical Pharmacist [facility] [phone]

## 2021-01-05 NOTE — Patient Instructions (Signed)
Thank you for coming to see [name] today.   Blood pressure today is very high.   Please do the following:   1. Continue HCTZ 25 mg - take 1 tablet in the morning 2. Start taking carvedilol twice a day. 3. Start taking your cholesterol medication (rosuvastatin) in the evening.  4. Start taking amlodipine 5 mg - take 1 tablet in the evening.   Limiting salt and caffeine, as well as exercising as able for at least 30 minutes for 5 days out of the week, can also help you lower your blood pressure.  Take your blood pressure at home if you are able. Please write down these numbers and bring them to your visits.  If you have any questions about medications, please call me [phone].  Austin Leonard

## 2021-01-18 NOTE — Progress Notes (Signed)
S:     PCP: assigned to Dr. Alvis Lemmings, appt on 02/14/2021  Patient arrives in good spirits. Presents to the clinic for hypertension evaluation, counseling, and management. Patient was referred and last seen by Georgian Co on 12/15/2020. Pt was last seen by pharmacy on 01/05/21 and BP was elevated at 177/112. Pt reported taking carvedilol daily instead of twice daily as prescribed. He endorsed fatigue and blurry vision. Pt was started on amlodipine 5 mg daily and counseled on taking carvedilol twice daily.  Today, patient reports running out of HCTZ for 2 days, but has refilled medication. Reports medication adherence with amlodipine 5 mg daily and carvedilol 12.5 mg twice daily. Denies dizziness, headaches, blurred vision, and LE swelling. Reports he has not purchased a larger BP cuff, but plans to order one very soon. Home BP log range 150s-170s/90-110s with a high of 186/107 (see below). Reports still walking 3 miles 5 days/week, and minimizing salt and processed food intake.  Medication adherence fair.  Current BP Medications include: amlodipine 5 mg daily (PM), HCTZ 25 mg daily (AM), carvedilol 12.5 mg BID  Antihypertensives tried in the past include: none  Dietary habits include: - in the past 2 weeks he has cut out fast food - drinks 2-3 mixed alcoholic beverages daily - 1/2 cup of coffee daily  Exercise habits include: - walks 2-3 miles every other day  Family / Social history:  - current smoker (15 cigarettes/day) - mother: HTN, DM - father: HTN, stroke  O:  Vitals:   01/19/21 1109  BP: (!) 157/106  Pulse: 77    Home BP readings: 164/106, 186/107, 170/105, 167/111, 170/106, 166/104, 158/105, 173/106, 166/108, 150/95, 150/97, 150/91  Last 3 Office BP readings: BP Readings from Last 3 Encounters:  01/19/21 (!) 157/106  01/05/21 (!) 177/112  12/15/20 (!) 148/96    BMET    Component Value Date/Time   NA 140 12/15/2020 1518   K 4.6 12/15/2020 1518   CL 99  12/15/2020 1518   CO2 26 12/15/2020 1518   GLUCOSE 88 12/15/2020 1518   GLUCOSE 96 03/14/2018 1344   BUN 16 12/15/2020 1518   CREATININE 1.36 (H) 12/15/2020 1518   CALCIUM 9.8 12/15/2020 1518   GFRNONAA 59 (L) 12/15/2020 1518   GFRAA 69 12/15/2020 1518    Renal function: CrCl cannot be calculated (Patient's most recent lab result is older than the maximum 21 days allowed.).  Clinical ASCVD: No  The 10-year ASCVD risk score Denman George DC Jr., et al., 2013) is: 20.5%   Values used to calculate the score:     Age: 55 years     Sex: Male     Is Non-Hispanic African American: Yes     Diabetic: No     Tobacco smoker: Yes     Systolic Blood Pressure: 157 mmHg     Is BP treated: Yes     HDL Cholesterol: 56 mg/dL     Total Cholesterol: 151 mg/dL  A/P: Hypertension longstanding currently uncontrolled on current medications. BP Goal = <130/80 mmHg. Medication adherence appears fair. Encouraged patient to aim for a diet full of vegetables, fruit and lean meats (chicken, [place], fish) and to limit salt intake by eating fresh or frozen vegetables (instead of canned), rinse canned vegetables prior to cooking and do not add any additional salt to meals. Encouraged patient to continue to exercise 20-30 minutes daily with the goal of 150 minutes per week. Patient verbalized understanding. -Increased amlodipine to 10 mg daily -Continued HCTZ  25 mg daily -Continued carvedilol 12.5 mg BID -Counseled on lifestyle modifications for blood pressure control including reduced dietary sodium, increased exercise, adequate sleep.  Results reviewed and written information provided. Total time in face-to-face counseling 25 minutes.   F/U Visit with PCP in ~3 weeks.    Austin Leonard, PharmD, BCPS PGY2 Ambulatory Care Resident [facility]  Pharmacy

## 2021-01-19 ENCOUNTER — Ambulatory Visit: Payer: BC Managed Care – PPO | Attending: Family Medicine | Admitting: Pharmacist

## 2021-01-19 ENCOUNTER — Other Ambulatory Visit: Payer: Self-pay

## 2021-01-19 ENCOUNTER — Encounter: Payer: Self-pay | Admitting: Pharmacist

## 2021-01-19 VITALS — BP 157/106 | HR 77

## 2021-01-19 DIAGNOSIS — I10 Essential (primary) hypertension: Secondary | ICD-10-CM

## 2021-01-19 MED ORDER — AMLODIPINE BESYLATE 10 MG PO TABS: 10.0000 mg | ORAL_TABLET | Freq: Every day | ORAL | 3 refills | Status: DC

## 2021-02-14 ENCOUNTER — Ambulatory Visit: Payer: BC Managed Care – PPO | Admitting: Family Medicine

## 2021-02-26 ENCOUNTER — Other Ambulatory Visit: Payer: Self-pay | Admitting: Physician Assistant

## 2021-02-26 DIAGNOSIS — E785 Hyperlipidemia, unspecified: Secondary | ICD-10-CM

## 2021-02-26 NOTE — Telephone Encounter (Signed)
Requested Prescriptions  Pending Prescriptions Disp Refills  . rosuvastatin (CRESTOR) 10 MG tablet [Pharmacy Med Name: Rosuvastatin Calcium 10 MG Oral Tablet] 30 tablet 3    Sig: TAKE 1 TABLET BY MOUTH  DAILY     Cardiovascular:  Antilipid - Statins Failed - 02/26/2021  7:01 AM      Failed - LDL in normal range and within 360 days    LDL Chol Calc (NIH)  Date Value Ref Range Status  03/11/2020 74 0 - 99 mg/dL Final         Passed - Total Cholesterol in normal range and within 360 days    Cholesterol, Total  Date Value Ref Range Status  03/11/2020 151 100 - 199 mg/dL Final         Passed - HDL in normal range and within 360 days    HDL  Date Value Ref Range Status  03/11/2020 56 >39 mg/dL Final         Passed - Triglycerides in normal range and within 360 days    Triglycerides  Date Value Ref Range Status  03/11/2020 118 0 - 149 mg/dL Final         Passed - Patient is not pregnant      Passed - Valid encounter within last 12 months    Recent Outpatient Visits          1 month ago Essential hypertension   [facility] [place], Cornelius Moras, RPH-CPP   1 month ago Hypertensive urgency   [place] [place], Cornelius Moras, RPH-CPP   2 months ago Hypertensive urgency   [place] [address] [facility] [place], [place], [place]   1 year ago Tobacco use   [place] [address] [facility] [place], [place], [place]   1 year ago Essential hypertension   [place] [place], [place], [place]

## 2021-03-22 ENCOUNTER — Other Ambulatory Visit: Payer: Self-pay | Admitting: Family Medicine

## 2021-03-22 DIAGNOSIS — E785 Hyperlipidemia, unspecified: Secondary | ICD-10-CM

## 2021-03-22 DIAGNOSIS — I10 Essential (primary) hypertension: Secondary | ICD-10-CM

## 2021-03-22 MED ORDER — CARVEDILOL 12.5 MG PO TABS: ORAL_TABLET | Freq: Two times a day (BID) | ORAL | 0 refills | Status: DC

## 2021-03-22 MED ORDER — AMLODIPINE BESYLATE 10 MG PO TABS: 10.0000 mg | ORAL_TABLET | Freq: Every day | ORAL | 0 refills | Status: DC

## 2021-03-22 MED ORDER — ROSUVASTATIN CALCIUM 10 MG PO TABS
10.0000 mg | ORAL_TABLET | Freq: Every day | ORAL | 0 refills | Status: DC
Start: 2021-03-22 — End: 2021-04-15

## 2021-03-22 MED ORDER — HYDROCHLOROTHIAZIDE 25 MG PO TABS: ORAL_TABLET | Freq: Every day | ORAL | 0 refills | Status: DC

## 2021-03-22 NOTE — Telephone Encounter (Signed)
Medication Refill - Medication: rosuvastatin (CRESTOR) 10 MG tablet   amLODipine (NORVASC) 10 MG tablet  carvedilol (COREG) 12.5 MG tablet  hydrochlorothiazide (HYDRODIURIL) 25 MG tablet     Preferred Pharmacy (with phone number or street name): [id number] [place] - [address] Phone:  [phone]  Fax:  [phone]       Agent: Please be advised that RX refills may take up to 3 business days. We ask that you follow-up with your pharmacy.

## 2021-03-22 NOTE — Telephone Encounter (Signed)
Patient is in medication adjustment period- he missed his last scheduled appointment- attempted to call patient - left message to call office for appointment- #30 day supply sent as courtesy

## 2021-03-24 ENCOUNTER — Telehealth: Payer: Self-pay

## 2021-03-24 NOTE — Telephone Encounter (Signed)
Patient requested appt with Georgian Co for Blood Check. Called Pt no answer. Left vm for Pt to call [phone] to schedule appt,

## 2021-04-15 ENCOUNTER — Other Ambulatory Visit: Payer: Self-pay | Admitting: Family Medicine

## 2021-04-15 DIAGNOSIS — E785 Hyperlipidemia, unspecified: Secondary | ICD-10-CM

## 2021-04-15 NOTE — Telephone Encounter (Signed)
Requested Prescriptions  Pending Prescriptions Disp Refills  . rosuvastatin (CRESTOR) 10 MG tablet [Pharmacy Med Name: ROSUVASTATIN CALCIUM 10 MG TAB] 90 tablet 0    Sig: TAKE 1 TABLET BY MOUTH EVERY DAY     Cardiovascular:  Antilipid - Statins Failed - 04/15/2021  2:34 PM      Failed - Total Cholesterol in normal range and within 360 days    Cholesterol, Total  Date Value Ref Range Status  03/11/2020 151 100 - 199 mg/dL Final         Failed - LDL in normal range and within 360 days    LDL Chol Calc (NIH)  Date Value Ref Range Status  03/11/2020 74 0 - 99 mg/dL Final         Failed - HDL in normal range and within 360 days    HDL  Date Value Ref Range Status  03/11/2020 56 >39 mg/dL Final         Failed - Triglycerides in normal range and within 360 days    Triglycerides  Date Value Ref Range Status  03/11/2020 118 0 - 149 mg/dL Final         Passed - Patient is not pregnant      Passed - Valid encounter within last 12 months    Recent Outpatient Visits          2 months ago Essential hypertension   [facility] [place], Cornelius Moras, RPH-CPP   3 months ago Hypertensive urgency   [facility] [place], Cornelius Moras, RPH-CPP   4 months ago Hypertensive urgency   [place] [address] [facility] [place], [place], [place]   1 year ago Tobacco use   [place] [address] [facility] [place], [place], [place]   2 years ago Essential hypertension   [place] [facility] [place], [place], [place]

## 2021-07-23 ENCOUNTER — Other Ambulatory Visit: Payer: Self-pay | Admitting: Family Medicine

## 2021-07-23 DIAGNOSIS — I10 Essential (primary) hypertension: Secondary | ICD-10-CM

## 2021-07-26 NOTE — Telephone Encounter (Signed)
Pt. Has appointment in November.

## 2021-09-05 ENCOUNTER — Other Ambulatory Visit: Payer: Self-pay | Admitting: Family Medicine

## 2021-09-05 DIAGNOSIS — I10 Essential (primary) hypertension: Secondary | ICD-10-CM

## 2021-09-05 NOTE — Telephone Encounter (Signed)
Medication Refill - Medication: Carvedilol  Has the patient contacted their pharmacy? Yes.   (Agent: If no, request that the patient contact the pharmacy for the refill.) (Agent: If yes, when and what did the pharmacy advise?)  Preferred Pharmacy (with phone number or street name): [facility]  [place] Has the patient been seen for an appointment in the last year OR does the patient have an upcoming appointment? yes  Agent: Please be advised that RX refills may take up to 3 business days. We ask that you follow-up with your pharmacy.

## 2021-09-06 NOTE — Telephone Encounter (Signed)
Requested medications are due for refill today Has rx with one refill at different pharm  Requested medications are on the active medication list yes  Last visit 12/15/20, asked to return in two months  Future visit scheduled 10/10/21  Notes to clinic No PCP listed, upcoming appt 10/10/21, please assess.  Requested Prescriptions  Pending Prescriptions Disp Refills   carvedilol (COREG) 12.5 MG tablet 60 tablet 2    Sig: Take by mouth 2 (two) times daily with a meal.     Cardiovascular:  Beta Blockers Failed - 09/05/2021  4:56 PM      Failed - Last BP in normal range    BP Readings from Last 1 Encounters:  01/19/21 (!) 157/106          Failed - Valid encounter within last 6 months    Recent Outpatient Visits           7 months ago Essential hypertension   [facility] [place], Cornelius Moras, RPH-CPP   8 months ago Hypertensive urgency   [facility] [place], Cornelius Moras, RPH-CPP   8 months ago Hypertensive urgency   [place] [address] [facility] [place], [place], [place]   1 year ago Tobacco use   [place] [address] [facility] [place], [place], [place]   2 years ago Essential hypertension    [facility] [place], [place], [place]       Future Appointments             In 1 month Hoy Register, MD [facility]            Passed - Last Heart Rate in normal range    Pulse Readings from Last 1 Encounters:  01/19/21 77

## 2021-09-07 MED ORDER — CARVEDILOL 12.5 MG PO TABS: 12.5000 mg | ORAL_TABLET | Freq: Two times a day (BID) | ORAL | 0 refills | Status: DC

## 2021-10-10 ENCOUNTER — Telehealth (HOSPITAL_BASED_OUTPATIENT_CLINIC_OR_DEPARTMENT_OTHER): Payer: BC Managed Care – PPO | Admitting: Family Medicine

## 2021-10-10 ENCOUNTER — Encounter: Payer: Self-pay | Admitting: Family Medicine

## 2021-10-10 DIAGNOSIS — E785 Hyperlipidemia, unspecified: Secondary | ICD-10-CM | POA: Diagnosis not present

## 2021-10-10 DIAGNOSIS — I10 Essential (primary) hypertension: Secondary | ICD-10-CM | POA: Diagnosis not present

## 2021-10-10 MED ORDER — AMLODIPINE BESYLATE 10 MG PO TABS: 10.0000 mg | ORAL_TABLET | Freq: Every day | ORAL | 6 refills | Status: DC

## 2021-10-10 MED ORDER — CARVEDILOL 25 MG PO TABS: 25.0000 mg | ORAL_TABLET | Freq: Two times a day (BID) | ORAL | 6 refills | Status: DC

## 2021-10-10 MED ORDER — HYDROCHLOROTHIAZIDE 25 MG PO TABS: ORAL_TABLET | Freq: Every day | ORAL | 6 refills | Status: DC

## 2021-10-10 MED ORDER — ROSUVASTATIN CALCIUM 10 MG PO TABS: 10.0000 mg | ORAL_TABLET | Freq: Every day | ORAL | 6 refills | Status: DC

## 2021-10-10 NOTE — Progress Notes (Signed)
Virtual Visit via Video Note  I connected with Elissa Lovett, on 10/10/2021 at 3:32 PM by video enabled telemedicine device due to the COVID-19 pandemic and verified that I am speaking with the correct person using two identifiers.   Consent: I discussed the limitations, risks, security and privacy concerns of performing an evaluation and management service by telemedicine and the availability of in person appointments. I also discussed with the patient that there may be a patient responsible charge related to this service. The patient expressed understanding and agreed to proceed.   Location of Patient: Home  Location of Provider: Clinic   Persons participating in Telemedicine visit: Jayvion L Russ Halo Farrington-CMA Dr. Alvis Lemmings     History of Present Illness: Austin Leonard is a 55 y.o. year old male  with hypertension, hyperlipidemia seen today for an office visit. Last seen by the clinical pharmacist in 01/2021 when he endorses compliance with his antihypertensives and his statin. Blood Pressure at home has been 150/90. Denies presence of chest pain or dyspnea and has no acute concerns today. Last set of labs from 11/2020 revealed slightly elevated creatinine of 1.36, last cholesterol was in 02/2020 and was normal. 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 (Patient not taking: Reported on 12/15/2020)     amLODipine (NORVASC) 10 MG tablet Take 1 tablet (10 mg total) by mouth daily. 30 tablet 0   carvedilol (COREG) 12.5 MG tablet Take 1 tablet (12.5 mg total) by mouth 2 (two) times daily with a meal. 60 tablet 0   hydrochlorothiazide (HYDRODIURIL) 25 MG tablet TAKE 1 TABLET (25 MG TOTAL) BY MOUTH DAILY. 30 tablet 0    ibuprofen (ADVIL,MOTRIN) 200 MG tablet Take 600 mg by mouth every 6 (six) hours as needed (pain). (Patient not taking: Reported on 12/15/2020)     rosuvastatin (CRESTOR) 10 MG tablet TAKE 1 TABLET BY MOUTH EVERY DAY 90 tablet 0   Current Facility-Administered Medications on File Prior to Visit  Medication Dose Route Frequency Provider Last Rate Last Admin   cloNIDine (CATAPRES) tablet 0.2 mg  0.2 mg Oral Once McClung, Angela M, PA-C        ROS: See HPI  Observations/Objective: Awake, alert, oriented x3 Not in acute distress Normal mood   CMP Latest Ref Rng & Units 12/15/2020 03/11/2020 11/26/2018  Glucose 65 - 99 mg/dL 88 88 90  BUN 6 - 24 mg/dL 16 17 18   Creatinine 0.76 - 1.27 mg/dL ) 4.09(W 1.19  Sodium 134 - 144 mmol/L 140 138 141  Potassium 3.5 - 5.2 mmol/L 4.6 4.8 4.4  Chloride 96 - 106 mmol/L 99 98 101  CO2 20 - 29 mmol/L 26 - 24  Calcium 8.7 - 10.2 mg/dL 9.8 9.9 9.7  Total Protein 6.0 - 8.5 g/dL 7.8 7.8 7.1  Total Bilirubin 0.0 - 1.2 mg/dL 0.4 0.5 0.3  Alkaline Phos 44 - 121 IU/L 96 99 91  AST 0 - 40 IU/L 22 22 22   ALT 0 - 44 IU/L 23 - 26    Lipid Panel     Component Value Date/Time   CHOL 151 03/11/2020 1411   TRIG 118 03/11/2020 1411   HDL 56 03/11/2020 1411   CHOLHDL 2.7  03/11/2020 1411   LDLCALC 74 03/11/2020 1411   LABVLDL 21 03/11/2020 1411    Lab Results  Component Value Date   HGBA1C 5.5 12/15/2020     Assessment and Plan: 1. Essential hypertension Uncontrolled Increase Coreg dose We will have him come into the office to reassess blood pressure Counseled on blood pressure goal of less than 130/80, low-sodium, DASH diet, medication compliance, 150 minutes of moderate intensity exercise per week. Discussed medication compliance, adverse effects. - carvedilol (COREG) 25 MG tablet; Take 1 tablet (25 mg total) by mouth 2 (two) times daily with a meal.  Dispense: 60 tablet; Refill: 6 - amLODipine (NORVASC) 10 MG tablet; Take 1 tablet (10 mg total)  by mouth daily.  Dispense: 30 tablet; Refill: 6 - hydrochlorothiazide (HYDRODIURIL) 25 MG tablet; TAKE 1 TABLET (25 MG TOTAL) BY MOUTH DAILY.  Dispense: 30 tablet; Refill: 6  2. Hyperlipidemia, unspecified hyperlipidemia type Last lipid panel was in 02/2020 He is due for a repeat Low-cholesterol diet - rosuvastatin (CRESTOR) 10 MG tablet; Take 1 tablet (10 mg total) by mouth daily.  Dispense: 30 tablet; Refill: 6   Follow Up Instructions: 1 month to follow-up on hypertension interval to have fasting labs.   I discussed the assessment and treatment plan with the patient. The patient was provided an opportunity to ask questions and all were answered. The patient agreed with the plan and demonstrated an understanding of the instructions.   The patient was advised to call back or seek an in-person evaluation if the symptoms worsen or if the condition fails to improve as anticipated.     I provided 15 minutes total of Telehealth time during this encounter including median intraservice time, reviewing previous notes, investigations, ordering medications, medical decision making, coordinating care and patient verbalized understanding at the end of the visit.     Hoy Register, MD, FAAFP. [facility] [place], [place] [phone]   10/10/2021, 3:32 PM

## 2022-01-04 ENCOUNTER — Ambulatory Visit: Payer: BC Managed Care – PPO | Attending: Family Medicine | Admitting: Family Medicine

## 2022-01-04 ENCOUNTER — Other Ambulatory Visit: Payer: Self-pay

## 2022-01-04 ENCOUNTER — Encounter: Payer: Self-pay | Admitting: Family Medicine

## 2022-01-04 VITALS — BP 128/84 | HR 81 | Ht 65.0 in | Wt 238.4 lb

## 2022-01-04 DIAGNOSIS — Z1211 Encounter for screening for malignant neoplasm of colon: Secondary | ICD-10-CM | POA: Diagnosis not present

## 2022-01-04 DIAGNOSIS — Z1159 Encounter for screening for other viral diseases: Secondary | ICD-10-CM

## 2022-01-04 DIAGNOSIS — I10 Essential (primary) hypertension: Secondary | ICD-10-CM | POA: Diagnosis not present

## 2022-01-04 DIAGNOSIS — E785 Hyperlipidemia, unspecified: Secondary | ICD-10-CM | POA: Diagnosis not present

## 2022-01-04 DIAGNOSIS — N529 Male erectile dysfunction, unspecified: Secondary | ICD-10-CM | POA: Diagnosis not present

## 2022-01-04 DIAGNOSIS — F17210 Nicotine dependence, cigarettes, uncomplicated: Secondary | ICD-10-CM

## 2022-01-04 DIAGNOSIS — Z131 Encounter for screening for diabetes mellitus: Secondary | ICD-10-CM

## 2022-01-04 MED ORDER — SILDENAFIL CITRATE 50 MG PO TABS: 50.0000 mg | ORAL_TABLET | Freq: Every day | ORAL | 1 refills | Status: AC | PRN

## 2022-01-04 MED ORDER — ROSUVASTATIN CALCIUM 10 MG PO TABS: 10.0000 mg | ORAL_TABLET | Freq: Every day | ORAL | 1 refills | Status: DC

## 2022-01-04 MED ORDER — NICOTINE 14 MG/24HR TD PT24: 14.0000 mg | MEDICATED_PATCH | Freq: Every day | TRANSDERMAL | 2 refills | Status: DC

## 2022-01-04 MED ORDER — CARVEDILOL 25 MG PO TABS: 25.0000 mg | ORAL_TABLET | Freq: Two times a day (BID) | ORAL | 1 refills | Status: DC

## 2022-01-04 MED ORDER — AMLODIPINE BESYLATE 10 MG PO TABS: 10.0000 mg | ORAL_TABLET | Freq: Every day | ORAL | 1 refills | Status: DC

## 2022-01-04 MED ORDER — HYDROCHLOROTHIAZIDE 25 MG PO TABS: ORAL_TABLET | Freq: Every day | ORAL | 1 refills | Status: DC

## 2022-01-04 NOTE — Progress Notes (Signed)
Subjective:  Patient ID: Austin Leonard, male    DOB: 30-Jan-1966  Age: 56 y.o. MRN: [id number]  CC: Hypertension   HPI Austin Leonard is a 56 y.o. year old male with a history of hypertension, hyperlipidemia, previous stroke, tobacco abuse seen today for an office visit.  Interval History: He goes walking 2 days a week he is planning on adding weightlifting to his schedule. Endorses compliance with his antihypertensive and his statin. 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  He would like something for erectile dysfunction. Endorses smoking about 15 cigarettes/day since he was 56.  He would like something to help him quit smoking. 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   Hypertension     Past Surgical History:  Procedure Laterality Date   CRANIOTOMY N/A 02/27/2018   Procedure: CRANIECTOMY FOR SUBOCCIPTAL HEMORRHAGE;  Surgeon: Eustace Moore, MD;  Location: [place];  Service: Neurosurgery;  Laterality: N/A;   FACIAL LACERATION REPAIR Left 02/27/2018   Procedure: EYELID  LACERATION CLOSURE;  Surgeon: Jodi Marble, MD;  Location: [place];  Service: ENT;  Laterality: Left;    Family History  Problem Relation Age of Onset   Hypertension Mother    Diabetes Mother    Hypertension Father    CVA Father     No Known Allergies  Outpatient Medications Prior to Visit  Medication Sig Dispense Refill   amLODipine (NORVASC) 10 MG tablet Take 1 tablet (10 mg total) by mouth daily. 30 tablet 6   carvedilol (COREG) 25 MG tablet Take 1 tablet (25 mg total) by mouth 2 (two) times daily with a meal. 60 tablet 6   hydrochlorothiazide (HYDRODIURIL) 25 MG tablet TAKE 1 TABLET (25 MG TOTAL) BY MOUTH DAILY. 30 tablet 6   rosuvastatin (CRESTOR) 10 MG tablet Take 1 tablet (10 mg total) by mouth daily. 30 tablet 6   acetaminophen (TYLENOL) 500 MG tablet Take 1,000 mg by mouth every 8 (eight) hours as needed (pain). (Patient not  taking: Reported on 12/15/2020)     ibuprofen (ADVIL,MOTRIN) 200 MG tablet Take 600 mg by mouth every 6 (six) hours as needed (pain). (Patient not taking: Reported on 12/15/2020)     Facility-Administered Medications Prior to Visit  Medication Dose Route Frequency Provider Last Rate Last Admin   cloNIDine (CATAPRES) tablet 0.2 mg  0.2 mg Oral Once McClung, Angela M, PA-C         ROS Review of Systems  Constitutional:  Negative for activity change and appetite change.  HENT:  Negative for sinus pressure and sore throat.   Eyes:  Negative for visual disturbance.  Respiratory:  Negative for cough, chest tightness and shortness of breath.   Cardiovascular:  Negative for chest pain and leg swelling.  Gastrointestinal:  Negative for abdominal distention, abdominal pain, constipation and diarrhea.  Endocrine: Negative.   Genitourinary:  Negative for dysuria.  Musculoskeletal:  Negative for joint swelling and myalgias.  Skin:  Negative for rash.  Allergic/Immunologic: Negative.   Neurological:  Negative for weakness, light-headedness and numbness.  Psychiatric/Behavioral:  Negative for dysphoric mood and suicidal ideas.    Objective:  BP 128/84    Pulse 81    Ht 5' 5"  (1.651 m)    Wt 238 lb 6.4 oz (108.1 kg)    SpO2 100%    BMI 39.67 kg/m   BP/Weight 01/04/2022 01/19/2021 0/71/2197  Systolic BP 588 325 498  Diastolic BP 84 264  112  Wt. (Lbs) 238.4 - -  BMI 39.67 - -      Physical Exam Constitutional:      Appearance: He is well-developed.  Cardiovascular:     Rate and Rhythm: Normal rate.     Heart sounds: Normal heart sounds. No murmur heard. Pulmonary:     Effort: Pulmonary effort is normal.     Breath sounds: Normal breath sounds. No wheezing or rales.  Chest:     Chest wall: No tenderness.  Abdominal:     General: Bowel sounds are normal. There is no distension.     Palpations: Abdomen is soft. There is no mass.     Tenderness: There is no abdominal tenderness.   Musculoskeletal:        General: Normal range of motion.     Right lower leg: No edema.     Left lower leg: No edema.  Neurological:     Mental Status: He is alert and oriented to person, place, and time.     Comments: Strength is 4/5 in left upper and left lower extremity; 5/5 in right upper and right lower extremity  Psychiatric:        Mood and Affect: Mood normal.    CMP Latest Ref Rng & Units 12/15/2020 03/11/2020 11/26/2018  Glucose 65 - 99 mg/dL 88 88 90  BUN 6 - 24 mg/dL 16 17 18   Creatinine 0.76 - 1.27 mg/dL 1.36(H) 1.26 1.21  Sodium 134 - 144 mmol/L 140 138 141  Potassium 3.5 - 5.2 mmol/L 4.6 4.8 4.4  Chloride 96 - 106 mmol/L 99 98 101  CO2 20 - 29 mmol/L 26 - 24  Calcium 8.7 - 10.2 mg/dL 9.8 9.9 9.7  Total Protein 6.0 - 8.5 g/dL 7.8 7.8 7.1  Total Bilirubin 0.0 - 1.2 mg/dL 0.4 0.5 0.3  Alkaline Phos 44 - 121 IU/L 96 99 91  AST 0 - 40 IU/L 22 22 22   ALT 0 - 44 IU/L 23 - 26    Lipid Panel     Component Value Date/Time   CHOL 151 03/11/2020 1411   TRIG 118 03/11/2020 1411   HDL 56 03/11/2020 1411   CHOLHDL 2.7 03/11/2020 1411   LDLCALC 74 03/11/2020 1411    CBC    Component Value Date/Time   WBC 10.2 12/15/2020 1518   WBC 17.2 (H) 03/14/2018 1201   RBC 6.15 (H) 12/15/2020 1518   RBC 5.78 03/14/2018 1201   HGB 16.7 12/15/2020 1518   HCT 51.7 (H) 12/15/2020 1518   PLT 240 12/15/2020 1518   MCV 84 12/15/2020 1518   MCH 27.2 12/15/2020 1518   MCH 27.5 03/14/2018 1201   MCHC 32.3 12/15/2020 1518   MCHC 32.1 03/14/2018 1201   RDW 14.3 12/15/2020 1518   LYMPHSABS 2.2 12/15/2020 1518   EOSABS 0.6 (H) 12/15/2020 1518   BASOSABS 0.1 12/15/2020 1518    Lab Results  Component Value Date   HGBA1C 5.5 12/15/2020    Assessment & Plan:  1. Essential hypertension Controlled Counseled on blood pressure goal of less than 130/80, low-sodium, DASH diet, medication compliance, 150 minutes of moderate intensity exercise per week. Discussed medication compliance,  adverse effects. - amLODipine (NORVASC) 10 MG tablet; Take 1 tablet (10 mg total) by mouth daily.  Dispense: 90 tablet; Refill: 1 - carvedilol (COREG) 25 MG tablet; Take 1 tablet (25 mg total) by mouth 2 (two) times daily with a meal.  Dispense: 90 tablet; Refill: 1 - hydrochlorothiazide (HYDRODIURIL) 25 MG  tablet; TAKE 1 TABLET (25 MG TOTAL) BY MOUTH DAILY.  Dispense: 90 tablet; Refill: 1 - CMP14+EGFR; Future  2. Hyperlipidemia, unspecified hyperlipidemia type Controlled Low-cholesterol diet - rosuvastatin (CRESTOR) 10 MG tablet; Take 1 tablet (10 mg total) by mouth daily.  Dispense: 90 tablet; Refill: 1 - LP+Non-HDL Cholesterol; Future  3. Screening for colon cancer - Ambulatory referral to Gastroenterology  4. Erectile dysfunction, unspecified erectile dysfunction type - sildenafil (VIAGRA) 50 MG tablet; Take 1 tablet (50 mg total) by mouth daily as needed for erectile dysfunction. At least 24 hours between doses.  Dispense: 10 tablet; Refill: 1  5. Screening for viral disease - HCV Ab w Reflex to Quant PCR; Future - HIV Antibody (routine testing w rflx); Future  6. Screening for diabetes mellitus - Hemoglobin A1c; Future  7. Smoking greater than 20 pack years Spent 3 minutes counseling on smoking cessation and has low-dose effective smoking.  Discussed that smoking increases his risk for cardiovascular events and he is willing to work on smoking cessation. - nicotine (NICODERM CQ) 14 mg/24hr patch; Place 1 patch (14 mg total) onto the skin daily. For 1 month then changed to 7 mg daily  Dispense: 28 patch; Refill: 2 - CT CHEST LUNG CANCER SCREENING LOW DOSE WO CONTRAST; Future   No orders of the defined types were placed in this encounter.   Follow-up: Return in about 6 months (around 07/04/2022).       Charlott Rakes, MD, FAAFP. [facility] [place], [place]   01/04/2022, 3:48 PM

## 2022-01-04 NOTE — Patient Instructions (Signed)
Exercising to Stay Healthy °To become healthy and stay healthy, it is recommended that you do moderate-intensity and vigorous-intensity exercise. You can tell that you are exercising at a moderate intensity if your heart starts beating faster and you start breathing faster but can still hold a conversation. You can tell that you are exercising at a vigorous intensity if you are breathing much harder and faster and cannot hold a conversation while exercising. °How can exercise benefit me? °Exercising regularly is important. It has many health benefits, such as: °Improving overall fitness, flexibility, and endurance. °Increasing bone density. °Helping with weight control. °Decreasing body fat. °Increasing muscle strength and endurance. °Reducing stress and tension, anxiety, depression, or anger. °Improving overall health. °What guidelines should I follow while exercising? °Before you start a new exercise program, talk with your health care provider. °Do not exercise so much that you hurt yourself, feel dizzy, or get very short of breath. °Wear comfortable clothes and wear shoes with good support. °Drink plenty of water while you exercise to prevent dehydration or heat stroke. °Work out until your breathing and your heartbeat get faster (moderate intensity). °How often should I exercise? °Choose an activity that you enjoy, and set realistic goals. Your health care provider can help you make an activity plan that is individually designed and works best for you. °Exercise regularly as told by your health care provider. This may include: °Doing strength training two times a week, such as: °Lifting weights. °Using resistance bands. °Push-ups. °Sit-ups. °Yoga. °Doing a certain intensity of exercise for a given amount of time. Choose from these options: °A total of 150 minutes of moderate-intensity exercise every week. °A total of 75 minutes of vigorous-intensity exercise every week. °A mix of moderate-intensity and  vigorous-intensity exercise every week. °Children, pregnant women, people who have not exercised regularly, people who are overweight, and older adults may need to talk with a health care provider about what activities are safe to perform. If you have a medical condition, be sure to talk with your health care provider before you start a new exercise program. °What are some exercise ideas? °Moderate-intensity exercise ideas include: °Walking 1 mile (1.6 km) in about 15 minutes. °Biking. °Hiking. °Golfing. °Dancing. °Water aerobics. °Vigorous-intensity exercise ideas include: °Walking 4.5 miles (7.2 km) or more in about 1 hour. °Jogging or running 5 miles (8 km) in about 1 hour. °Biking 10 miles (16.1 km) or more in about 1 hour. °Lap swimming. °Roller-skating or in-line skating. °Cross-country skiing. °Vigorous competitive sports, such as football, basketball, and soccer. °Jumping rope. °Aerobic dancing. °What are some everyday activities that can help me get exercise? °Yard work, such as: °Pushing a lawn mower. °Raking and bagging leaves. °Washing your car. °Pushing a stroller. °Shoveling snow. °Gardening. °Washing windows or floors. °How can I be more active in my day-to-day activities? °Use stairs instead of an elevator. °Take a walk during your lunch break. °If you drive, park your car farther away from your work or school. °If you take public transportation, get off one stop early and walk the rest of the way. °Stand up or walk around during all of your indoor phone calls. °Get up, stretch, and walk around every 30 minutes throughout the day. °Enjoy exercise with a friend. Support to continue exercising will help you keep a regular routine of activity. °Where to find more information °You can find more information about exercising to stay healthy from: °U.S. Department of Health and Human Services: www.hhs.gov °Centers for Disease Control and Prevention (  CDC): www.cdc.gov °Summary °Exercising regularly is  important. It will improve your overall fitness, flexibility, and endurance. °Regular exercise will also improve your overall health. It can help you control your weight, reduce stress, and improve your bone density. °Do not exercise so much that you hurt yourself, feel dizzy, or get very short of breath. °Before you start a new exercise program, talk with your health care provider. °This information is not intended to replace advice given to you by your health care provider. Make sure you discuss any questions you have with your health care provider. °Document Revised: 03/04/2021 Document Reviewed: 03/04/2021 °Elsevier Patient Education © 2022 [employer]vier Inc. ° °

## 2022-01-13 ENCOUNTER — Ambulatory Visit (HOSPITAL_COMMUNITY): Payer: Self-pay | Attending: Family Medicine

## 2022-02-20 ENCOUNTER — Telehealth: Payer: Self-pay | Admitting: Family Medicine

## 2022-02-20 NOTE — Telephone Encounter (Signed)
Pt was called and no VM was left to leave a message. Patient can be scheduled for a lab appointment at any time orders are in epic. ?

## 2022-02-20 NOTE — Telephone Encounter (Signed)
Copied from [id number] [phone]. Topic: General - Other >> Feb 20, 2022  1:04 PM Bayard Beaver wrote: Reason for CRM:pt has appt scheduled for jul 12 for fu appt, but asking for orders for blood work.

## 2022-03-09 ENCOUNTER — Other Ambulatory Visit: Payer: Self-pay

## 2022-05-01 ENCOUNTER — Other Ambulatory Visit: Payer: Self-pay | Admitting: Family Medicine

## 2022-05-01 DIAGNOSIS — I10 Essential (primary) hypertension: Secondary | ICD-10-CM

## 2022-05-02 NOTE — Telephone Encounter (Signed)
Requested Prescriptions  Pending Prescriptions Disp Refills  . carvedilol (COREG) 25 MG tablet [Pharmacy Med Name: Carvedilol 25 MG Oral Tablet] 90 tablet 0    Sig: TAKE 1 TABLET BY MOUTH TWICE DAILY WITH A MEAL     Cardiovascular: Beta Blockers 3 Failed - 05/01/2022 12:05 PM      Failed - Cr in normal range and within 360 days    Creatinine, Ser  Date Value Ref Range Status  12/15/2020 1.36 (H) 0.76 - 1.27 mg/dL Final         Failed - AST in normal range and within 360 days    AST  Date Value Ref Range Status  12/15/2020 22 0 - 40 IU/L Final         Failed - ALT in normal range and within 360 days    ALT  Date Value Ref Range Status  12/15/2020 23 0 - 44 IU/L Final         Passed - Last BP in normal range    BP Readings from Last 1 Encounters:  01/04/22 128/84         Passed - Last Heart Rate in normal range    Pulse Readings from Last 1 Encounters:  01/04/22 81         Passed - Valid encounter within last 6 months    Recent Outpatient Visits          3 months ago Screening for colon cancer   [facility] [place], Odette Horns, MD   6 months ago Essential hypertension   [facility] [place], Odette Horns, MD   1 year ago Essential hypertension   [facility] [place], Cornelius Moras, RPH-CPP   1 year ago Hypertensive urgency   [facility] [place], Cornelius Moras, RPH-CPP   1 year ago Hypertensive urgency   [facility] [place], Marzella [place], [place]      Future Appointments            In 4 weeks Hoy Register, MD [facility]

## 2022-05-31 ENCOUNTER — Ambulatory Visit: Payer: Self-pay | Admitting: Family Medicine

## 2023-04-05 ENCOUNTER — Ambulatory Visit: Payer: BC Managed Care – PPO | Attending: Family Medicine | Admitting: Family Medicine

## 2023-04-05 ENCOUNTER — Encounter: Payer: Self-pay | Admitting: Family Medicine

## 2023-04-05 VITALS — BP 150/99 | HR 88 | Temp 98.0°F | Ht 65.0 in | Wt 226.6 lb

## 2023-04-05 DIAGNOSIS — Z8673 Personal history of transient ischemic attack (TIA), and cerebral infarction without residual deficits: Secondary | ICD-10-CM

## 2023-04-05 DIAGNOSIS — Z125 Encounter for screening for malignant neoplasm of prostate: Secondary | ICD-10-CM

## 2023-04-05 DIAGNOSIS — I10 Essential (primary) hypertension: Secondary | ICD-10-CM

## 2023-04-05 DIAGNOSIS — F17210 Nicotine dependence, cigarettes, uncomplicated: Secondary | ICD-10-CM | POA: Diagnosis not present

## 2023-04-05 DIAGNOSIS — E785 Hyperlipidemia, unspecified: Secondary | ICD-10-CM | POA: Diagnosis not present

## 2023-04-05 DIAGNOSIS — L918 Other hypertrophic disorders of the skin: Secondary | ICD-10-CM

## 2023-04-05 DIAGNOSIS — Z1211 Encounter for screening for malignant neoplasm of colon: Secondary | ICD-10-CM

## 2023-04-05 DIAGNOSIS — Z131 Encounter for screening for diabetes mellitus: Secondary | ICD-10-CM

## 2023-04-05 DIAGNOSIS — Z1159 Encounter for screening for other viral diseases: Secondary | ICD-10-CM

## 2023-04-05 MED ORDER — AMLODIPINE BESYLATE 10 MG PO TABS
10.0000 mg | ORAL_TABLET | Freq: Every day | ORAL | 1 refills | Status: AC
Start: 2023-04-05 — End: ?

## 2023-04-05 MED ORDER — ROSUVASTATIN CALCIUM 10 MG PO TABS
10.0000 mg | ORAL_TABLET | Freq: Every day | ORAL | 1 refills | Status: AC
Start: 2023-04-05 — End: ?

## 2023-04-05 MED ORDER — HYDROCHLOROTHIAZIDE 25 MG PO TABS
ORAL_TABLET | Freq: Every day | ORAL | 1 refills | Status: AC
Start: 2023-04-05 — End: 2024-04-04

## 2023-04-05 MED ORDER — CARVEDILOL 25 MG PO TABS
25.0000 mg | ORAL_TABLET | Freq: Two times a day (BID) | ORAL | 1 refills | Status: AC
Start: 2023-04-05 — End: ?

## 2023-04-05 NOTE — Progress Notes (Signed)
Subjective:  Patient ID: Austin Leonard, male    DOB: 1966/01/15  Age: 57 y.o. MRN: [id number]  CC: Hypertension   HPI Austin Leonard is a 57 y.o. year old male with a history of hypertension, hyperlipidemia, previous stroke, tobacco abuse (smoking 15 cigarettes/day x 38 years) seen today for an office visit.  Last visit was over a year ago. He just found out I am out of Network with his insurance and will be transferring care to another PCP.  Interval History:  He has been unable to obtain nicotine patches as they are expensive and his insurance did not cover them.  I had ordered a low-dose CT scan for lung cancer screening which he never underwent.  Blood pressure is elevated and he has been out of some of his medications. He has no residual deficits from his stroke. Does not exercise regularly. He has a couple of skin tags on his neck which he would like frozen as his sister-in-law just had them done. 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   Hypertension     Past Surgical History:  Procedure Laterality Date   CRANIOTOMY N/A 02/27/2018   Procedure: CRANIECTOMY FOR SUBOCCIPTAL HEMORRHAGE;  Surgeon: Tia Alert, MD;  Location: [facility] OR;  Service: Neurosurgery;  Laterality: N/A;   FACIAL LACERATION REPAIR Left 02/27/2018   Procedure: EYELID  LACERATION CLOSURE;  Surgeon: Flo Shanks, MD;  Location: [facility] OR;  Service: ENT;  Laterality: Left;    Family History  Problem Relation Age of Onset   Hypertension Mother    Diabetes Mother    Hypertension Father    CVA Father     Social History   Socioeconomic History   Marital status: Married    Spouse name: Not on file   Number of children: Not on file   Years of education: Not on file   Highest education level: Not on file  Occupational History   Not on file  Tobacco Use   Smoking status: Every Day    Packs/day: .5    Types: Cigarettes   Smokeless tobacco: Never  Vaping Use    Vaping Use: Every day  Substance and Sexual Activity   Alcohol use: Yes    Comment: occ   Drug use: No   Sexual activity: Yes  Other Topics Concern   Not on file  Social History Narrative   Not on file   Social Determinants of Health   Financial Resource Strain: Not on file  Food Insecurity: Not on file  Transportation Needs: Not on file  Physical Activity: Not on file  Stress: Not on file  Social Connections: Not on file    No Known Allergies  Outpatient Medications Prior to Visit  Medication Sig Dispense Refill   ibuprofen (ADVIL,MOTRIN) 200 MG tablet Take 600 mg by mouth every 6 (six) hours as needed (pain).     sildenafil (VIAGRA) 50 MG tablet Take 1 tablet (50 mg total) by mouth daily as needed for erectile dysfunction. At least 24 hours between doses. 10 tablet 1   amLODipine (NORVASC) 10 MG tablet Take 1 tablet (10 mg total) by mouth daily. 90 tablet 1   carvedilol (COREG) 25 MG tablet TAKE 1 TABLET BY MOUTH TWICE DAILY WITH A MEAL 90 tablet 0   rosuvastatin (CRESTOR) 10 MG tablet Take 1 tablet (10 mg total) by mouth daily. 90 tablet 1   acetaminophen (TYLENOL) 500 MG tablet Take 1,000 mg by  mouth every 8 (eight) hours as needed (pain). (Patient not taking: Reported on 12/15/2020)     nicotine (NICODERM CQ) 14 mg/24hr patch Place 1 patch (14 mg total) onto the skin daily. For 1 month then changed to 7 mg daily (Patient not taking: Reported on 04/05/2023) 28 patch 2   hydrochlorothiazide (HYDRODIURIL) 25 MG tablet TAKE 1 TABLET (25 MG TOTAL) BY MOUTH DAILY. 90 tablet 1   Facility-Administered Medications Prior to Visit  Medication Dose Route Frequency Provider Last Rate Last Admin   cloNIDine (CATAPRES) tablet 0.2 mg  0.2 mg Oral Once McClung, Angela M, PA-C         ROS Review of Systems  Constitutional:  Negative for activity change and appetite change.  HENT:  Negative for sinus pressure and sore throat.   Respiratory:  Negative for chest tightness, shortness of  breath and wheezing.   Cardiovascular:  Negative for chest pain and palpitations.  Gastrointestinal:  Negative for abdominal distention, abdominal pain and constipation.  Genitourinary: Negative.   Musculoskeletal: Negative.   Skin:  Positive for rash.  Psychiatric/Behavioral:  Negative for behavioral problems and dysphoric mood.     Objective:  BP (!) 150/99   Pulse 88   Temp 98 F (36.7 C) (Oral)   Ht 5\' 5"  (1.651 m)   Wt 226 lb 9.6 oz (102.8 kg)   SpO2 99%   BMI 37.71 kg/m      04/05/2023    9:34 AM 04/05/2023    8:58 AM 01/04/2022    3:44 PM  BP/Weight  Systolic BP 150 158 128  Diastolic BP 99 106 84  Wt. (Lbs)  226.6 238.4  BMI  37.71 kg/m2 39.67 kg/m2      Physical Exam Constitutional:      Appearance: He is well-developed.  Cardiovascular:     Rate and Rhythm: Normal rate.     Heart sounds: Normal heart sounds. No murmur heard. Pulmonary:     Effort: Pulmonary effort is normal.     Breath sounds: Normal breath sounds. No wheezing or rales.  Chest:     Chest wall: No tenderness.  Abdominal:     General: Bowel sounds are normal. There is no distension.     Palpations: Abdomen is soft. There is no mass.     Tenderness: There is no abdominal tenderness.  Musculoskeletal:        General: Normal range of motion.     Right lower leg: No edema.     Left lower leg: No edema.  Skin:    Comments: Hyperpigmented skin tags on neck  Neurological:     Mental Status: He is alert and oriented to person, place, and time.  Psychiatric:        Mood and Affect: Mood normal.        Latest Ref Rng & Units 12/15/2020    3:18 PM 03/11/2020    2:11 PM 11/26/2018   10:40 AM  CMP  Glucose 65 - 99 mg/dL 88  88  90   BUN 6 - 24 mg/dL 16  17  18    Creatinine 0.76 - 1.27 mg/dL 1.61  0.96  0.45   Sodium 134 - 144 mmol/L 140  138  141   Potassium 3.5 - 5.2 mmol/L 4.6  4.8  4.4   Chloride 96 - 106 mmol/L 99  98  101   CO2 20 - 29 mmol/L 26   24   Calcium 8.7 - 10.2 mg/dL 9.8   9.9  9.7   Total Protein 6.0 - 8.5 g/dL 7.8  7.8  7.1   Total Bilirubin 0.0 - 1.2 mg/dL 0.4  0.5  0.3   Alkaline Phos 44 - 121 IU/L 96  99  91   AST 0 - 40 IU/L 22  22  22    ALT 0 - 44 IU/L 23   26     Lipid Panel     Component Value Date/Time   CHOL 151 03/11/2020 1411   TRIG 118 03/11/2020 1411   HDL 56 03/11/2020 1411   CHOLHDL 2.7 03/11/2020 1411   LDLCALC 74 03/11/2020 1411    CBC    Component Value Date/Time   WBC 10.2 12/15/2020 1518   WBC 17.2 (H) 03/14/2018 1201   RBC 6.15 (H) 12/15/2020 1518   RBC 5.78 03/14/2018 1201   HGB 16.7 12/15/2020 1518   HCT 51.7 (H) 12/15/2020 1518   PLT 240 12/15/2020 1518   MCV 84 12/15/2020 1518   MCH 27.2 12/15/2020 1518   MCH 27.5 03/14/2018 1201   MCHC 32.3 12/15/2020 1518   MCHC 32.1 03/14/2018 1201   RDW 14.3 12/15/2020 1518   LYMPHSABS 2.2 12/15/2020 1518   EOSABS 0.6 (H) 12/15/2020 1518   BASOSABS 0.1 12/15/2020 1518    Lab Results  Component Value Date   HGBA1C 5.5 12/15/2020    Assessment & Plan:  1. Essential hypertension Uncontrolled due to running out of antihypertensive which I have refilled Counseled on blood pressure goal of less than 130/80, low-sodium, DASH diet, medication compliance, 150 minutes of moderate intensity exercise per week. Discussed medication compliance, adverse effects. - amLODipine (NORVASC) 10 MG tablet; Take 1 tablet (10 mg total) by mouth daily.  Dispense: 90 tablet; Refill: 1 - carvedilol (COREG) 25 MG tablet; Take 1 tablet (25 mg total) by mouth 2 (two) times daily with a meal.  Dispense: 180 tablet; Refill: 1 - hydrochlorothiazide (HYDRODIURIL) 25 MG tablet; TAKE 1 TABLET (25 MG TOTAL) BY MOUTH DAILY.  Dispense: 90 tablet; Refill: 1  2. Hyperlipidemia, unspecified hyperlipidemia type Controlled Continue statin Low-cholesterol diet - LP+Non-HDL Cholesterol; Future - CMP14+EGFR; Future - CBC with Differential/Platelet; Future - rosuvastatin (CRESTOR) 10 MG tablet; Take 1 tablet  (10 mg total) by mouth daily.  Dispense: 90 tablet; Refill: 1  3. Screening for prostate cancer - PSA, total and free; Future  4. Screening for viral disease - HIV Antibody (routine testing w rflx); Future - HCV Ab w Reflex to Quant PCR; Future  5. Screening for diabetes mellitus - Hemoglobin A1c; Future  6. Skin tag - Ambulatory referral to Dermatology  7. Screening for colon cancer - Ambulatory referral to Gastroenterology  8. Smoking greater than 20 pack years He is not ready to quit smoking at this time Spent 3 minutes counseling on smoking cessation and hazardous effect of smoking - CT CHEST LUNG CANCER SCREENING LOW DOSE WO CONTRAST; Future  9. History of stroke No residual deficits Secondary prevention Continue statin Risk factor modification including blood pressure control and smoking cessation    Meds ordered this encounter  Medications   amLODipine (NORVASC) 10 MG tablet    Sig: Take 1 tablet (10 mg total) by mouth daily.    Dispense:  90 tablet    Refill:  1   carvedilol (COREG) 25 MG tablet    Sig: Take 1 tablet (25 mg total) by mouth 2 (two) times daily with a meal.    Dispense:  180 tablet    Refill:  1   hydrochlorothiazide (HYDRODIURIL) 25 MG tablet    Sig: TAKE 1 TABLET (25 MG TOTAL) BY MOUTH DAILY.    Dispense:  90 tablet    Refill:  1   rosuvastatin (CRESTOR) 10 MG tablet    Sig: Take 1 tablet (10 mg total) by mouth daily.    Dispense:  90 tablet    Refill:  1    Follow-up: Return for Chronic medical conditions he will be establishing with another PCP in his network.Hoy Register, MD, FAAFP. [facility] [place], [place] [phone]   04/05/2023, 11:12 AM

## 2023-04-05 NOTE — Patient Instructions (Signed)

## 2023-04-09 ENCOUNTER — Ambulatory Visit: Payer: BC Managed Care – PPO | Attending: Family Medicine

## 2023-04-09 DIAGNOSIS — Z1159 Encounter for screening for other viral diseases: Secondary | ICD-10-CM

## 2023-04-09 DIAGNOSIS — E785 Hyperlipidemia, unspecified: Secondary | ICD-10-CM

## 2023-04-09 DIAGNOSIS — Z131 Encounter for screening for diabetes mellitus: Secondary | ICD-10-CM

## 2023-04-09 DIAGNOSIS — Z125 Encounter for screening for malignant neoplasm of prostate: Secondary | ICD-10-CM

## 2023-04-10 LAB — CBC WITH DIFFERENTIAL/PLATELET
Basophils Absolute: 0.1 10*3/uL (ref 0.0–0.2)
Basos: 1 %
EOS (ABSOLUTE): 0.7 10*3/uL — ABNORMAL HIGH (ref 0.0–0.4)
Eos: 9 %
Hematocrit: 51.9 % — ABNORMAL HIGH (ref 37.5–51.0)
Hemoglobin: 16.6 g/dL (ref 13.0–17.7)
Immature Grans (Abs): 0 10*3/uL (ref 0.0–0.1)
Immature Granulocytes: 0 %
Lymphocytes Absolute: 2.2 10*3/uL (ref 0.7–3.1)
Lymphs: 28 %
MCH: 28 pg (ref 26.6–33.0)
MCHC: 32 g/dL (ref 31.5–35.7)
MCV: 88 fL (ref 79–97)
Monocytes Absolute: 0.7 10*3/uL (ref 0.1–0.9)
Monocytes: 10 %
Neutrophils Absolute: 4 10*3/uL (ref 1.4–7.0)
Neutrophils: 52 %
Platelets: 253 10*3/uL (ref 150–450)
RBC: 5.93 x10E6/uL — ABNORMAL HIGH (ref 4.14–5.80)
RDW: 14.6 % (ref 11.6–15.4)
WBC: 7.7 10*3/uL (ref 3.4–10.8)

## 2023-04-10 LAB — PSA, TOTAL AND FREE
PSA, Free Pct: 42 %
PSA, Free: 0.21 ng/mL
Prostate Specific Ag, Serum: 0.5 ng/mL (ref 0.0–4.0)

## 2023-04-10 LAB — CMP14+EGFR
ALT: 24 IU/L (ref 0–44)
AST: 25 IU/L (ref 0–40)
Albumin/Globulin Ratio: 1.3 (ref 1.2–2.2)
Albumin: 4.3 g/dL (ref 3.8–4.9)
Alkaline Phosphatase: 102 IU/L (ref 44–121)
BUN/Creatinine Ratio: 14 (ref 9–20)
BUN: 17 mg/dL (ref 6–24)
Bilirubin Total: 0.3 mg/dL (ref 0.0–1.2)
CO2: 23 mmol/L (ref 20–29)
Calcium: 9.5 mg/dL (ref 8.7–10.2)
Chloride: 101 mmol/L (ref 96–106)
Creatinine, Ser: 1.18 mg/dL (ref 0.76–1.27)
Globulin, Total: 3.2 g/dL (ref 1.5–4.5)
Glucose: 91 mg/dL (ref 70–99)
Potassium: 4.3 mmol/L (ref 3.5–5.2)
Sodium: 141 mmol/L (ref 134–144)
Total Protein: 7.5 g/dL (ref 6.0–8.5)
eGFR: 73 mL/min/{1.73_m2} (ref >59)

## 2023-04-10 LAB — LP+NON-HDL CHOLESTEROL
Cholesterol, Total: 160 mg/dL (ref 100–199)
HDL: 66 mg/dL (ref >39)
LDL Chol Calc (NIH): 77 mg/dL (ref 0–99)
Total Non-HDL-Chol (LDL+VLDL): 94 mg/dL (ref 0–129)
Triglycerides: 94 mg/dL (ref 0–149)
VLDL Cholesterol Cal: 17 mg/dL (ref 5–40)

## 2023-04-10 LAB — HIV ANTIBODY (ROUTINE TESTING W REFLEX): HIV Screen 4th Generation wRfx: NONREACTIVE

## 2023-04-10 LAB — HCV INTERPRETATION

## 2023-04-10 LAB — HEMOGLOBIN A1C
Est. average glucose Bld gHb Est-mCnc: 117 mg/dL
Hgb A1c MFr Bld: 5.7 % — ABNORMAL HIGH (ref 4.8–5.6)

## 2023-04-10 LAB — HCV AB W REFLEX TO QUANT PCR: HCV Ab: NONREACTIVE

## 2023-08-24 ENCOUNTER — Other Ambulatory Visit: Payer: Self-pay | Admitting: Family Medicine

## 2023-08-24 DIAGNOSIS — Z1211 Encounter for screening for malignant neoplasm of colon: Secondary | ICD-10-CM

## 2023-08-24 DIAGNOSIS — Z1212 Encounter for screening for malignant neoplasm of rectum: Secondary | ICD-10-CM

## 2024-01-10 LAB — COLOGUARD: COLOGUARD: POSITIVE — AB

## 2024-01-11 ENCOUNTER — Encounter: Payer: Self-pay | Admitting: Family Medicine

## 2024-01-11 ENCOUNTER — Other Ambulatory Visit: Payer: Self-pay | Admitting: Family Medicine

## 2024-01-11 DIAGNOSIS — R195 Other fecal abnormalities: Secondary | ICD-10-CM

## 2024-01-16 ENCOUNTER — Encounter: Payer: Self-pay | Admitting: Internal Medicine

## 2024-01-21 ENCOUNTER — Ambulatory Visit (AMBULATORY_SURGERY_CENTER): Payer: BC Managed Care – PPO

## 2024-01-21 VITALS — Ht 65.0 in | Wt 235.2 lb

## 2024-01-21 DIAGNOSIS — Z1211 Encounter for screening for malignant neoplasm of colon: Secondary | ICD-10-CM

## 2024-01-21 DIAGNOSIS — R195 Other fecal abnormalities: Secondary | ICD-10-CM

## 2024-01-21 MED ORDER — SUFLAVE 178.7 G PO SOLR: 1.0000 | Freq: Once | ORAL | 0 refills | Status: AC

## 2024-01-21 NOTE — Progress Notes (Signed)
 No egg or soy allergy known to patient  No issues known to pt with past sedation with any surgeries or procedures Patient denies ever being told they had issues or difficulty with intubation  No FH of Malignant Hyperthermia Pt is not on diet pills Pt is not on home 02  Pt is not on blood thinners  Pt denies issues with chronic constipation  No A fib or A flutter Have any cardiac testing pending--no Ambulates independently

## 2024-02-01 ENCOUNTER — Encounter: Payer: Self-pay | Admitting: Internal Medicine

## 2024-02-08 ENCOUNTER — Ambulatory Visit: Payer: BC Managed Care – PPO | Admitting: Internal Medicine

## 2024-02-08 ENCOUNTER — Encounter: Payer: Self-pay | Admitting: Internal Medicine

## 2024-02-08 VITALS — BP 130/88 | HR 79 | Temp 96.7°F | Resp 12 | Ht 65.0 in | Wt 235.2 lb

## 2024-02-08 DIAGNOSIS — Z1211 Encounter for screening for malignant neoplasm of colon: Secondary | ICD-10-CM

## 2024-02-08 DIAGNOSIS — D123 Benign neoplasm of transverse colon: Secondary | ICD-10-CM

## 2024-02-08 DIAGNOSIS — R195 Other fecal abnormalities: Secondary | ICD-10-CM

## 2024-02-08 DIAGNOSIS — D124 Benign neoplasm of descending colon: Secondary | ICD-10-CM

## 2024-02-08 DIAGNOSIS — K648 Other hemorrhoids: Secondary | ICD-10-CM | POA: Diagnosis not present

## 2024-02-08 DIAGNOSIS — K635 Polyp of colon: Secondary | ICD-10-CM | POA: Diagnosis not present

## 2024-02-08 DIAGNOSIS — D175 Benign lipomatous neoplasm of intra-abdominal organs: Secondary | ICD-10-CM

## 2024-02-08 DIAGNOSIS — K6389 Other specified diseases of intestine: Secondary | ICD-10-CM | POA: Diagnosis not present

## 2024-02-08 DIAGNOSIS — D120 Benign neoplasm of cecum: Secondary | ICD-10-CM

## 2024-02-08 MED ORDER — SODIUM CHLORIDE 0.9 % IV SOLN: 500.0000 mL | INTRAVENOUS | Status: DC

## 2024-02-08 NOTE — Progress Notes (Signed)
 Report to PACU, RN, vss, BBS= Clear.

## 2024-02-08 NOTE — Progress Notes (Signed)
 GASTROENTEROLOGY PROCEDURE H&P NOTE   Primary Care Physician: Patient, No Pcp Per    Reason for Procedure:   Positive Cologuard test  Plan:    Colonoscopy  Patient is appropriate for endoscopic procedure(s) in the ambulatory ([facility]) setting.  The nature of the procedure, as well as the risks, benefits, and alternatives were carefully and thoroughly reviewed with the patient. Ample time for discussion and questions allowed. The patient understood, was satisfied, and agreed to proceed.     HPI: Austin Leonard is a 58 y.o. male who presents for colonoscopy for positive Cologuard test. Denies blood in stools, changes in bowel habits, or unintentional weight loss. Denies family history of colon cancer.  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 Authorizing Provider  amLODipine (NORVASC) 10 MG tablet Take 1 tablet (10 mg total) by mouth daily. 04/05/23   Hoy Register, MD  carvedilol (COREG) 25 MG tablet Take 1 tablet (25 mg total) by mouth 2 (two) times daily with a meal. 04/05/23   Newlin, Enobong, MD  hydrochlorothiazide (HYDRODIURIL) 25 MG tablet TAKE 1 TABLET (25 MG TOTAL) BY MOUTH DAILY. 04/05/23 04/04/24  Hoy Register, MD  rosuvastatin (CRESTOR) 10 MG tablet Take 1 tablet (10 mg total) by mouth daily. 04/05/23   Hoy Register, MD  sildenafil (VIAGRA) 50 MG tablet Take 1 tablet (50 mg total) by mouth daily as needed for  erectile dysfunction. At least 24 hours between doses. Patient not taking: Reported on 01/21/2024 01/04/22   Hoy Register, MD    Current Outpatient Medications  Medication Sig Dispense Refill   amLODipine (NORVASC) 10 MG tablet Take 1 tablet (10 mg total) by mouth daily. 90 tablet 1   carvedilol (COREG) 25 MG tablet Take 1 tablet (25 mg total) by mouth 2 (two) times daily with a meal. 180 tablet 1   hydrochlorothiazide (HYDRODIURIL) 25 MG tablet TAKE 1 TABLET (25 MG TOTAL) BY MOUTH DAILY. 90 tablet 1   rosuvastatin (CRESTOR) 10 MG tablet Take 1 tablet (10 mg total) by mouth daily. 90 tablet 1   sildenafil (VIAGRA) 50 MG tablet Take 1 tablet (50 mg total) by mouth daily as needed for erectile dysfunction. At least 24 hours between doses. (Patient not taking: Reported on 01/21/2024) 10 tablet 1   Current Facility-Administered Medications  Medication Dose Route Frequency Provider Last Rate Last Admin   0.9 %  sodium chloride infusion  500 mL Intravenous Continuous Imogene Burn, MD        Allergies as of 02/08/2024   (No Known Allergies)    Family History  Problem Relation Age of Onset   Hypertension Mother    Diabetes Mother    Hypertension Father    CVA Father    Colon polyps Neg Hx    Esophageal cancer Neg Hx    Crohn's disease Neg Hx    Rectal cancer Neg Hx  Stomach cancer Neg Hx     Social History   Socioeconomic History   Marital status: Married    Spouse name: Not on file   Number of children: Not on file   Years of education: Not on file   Highest education level: Not on file  Occupational History   Not on file  Tobacco Use   Smoking status: Every Day    Current packs/day: 1.00    Average packs/day: 1 pack/day for 40.2 years (40.2 ttl pk-yrs)    Types: Cigarettes    Start date: 60   Smokeless tobacco: Never  Vaping Use   Vaping status: Every Day   Substances: CBD  Substance and Sexual Activity   Alcohol use: Yes    Comment: occ   Drug use: Yes     Frequency: 2.0 times per week    Types: Marijuana   Sexual activity: Yes  Other Topics Concern   Not on file  Social History Narrative   Not on file   Social Drivers of Health   Financial Resource Strain: Not on file  Food Insecurity: Not on file  Transportation Needs: Not on file  Physical Activity: Not on file  Stress: Not on file  Social Connections: Not on file  Intimate Partner Violence: Not on file    Physical Exam: Vital signs in last 24 hours: BP 124/78   Pulse 70   Temp (!) 96.7 F (35.9 C) (Temporal)   Ht 5\' 5"  (1.651 m)   Wt 235 lb 3.2 oz (106.7 kg)   SpO2 98%   BMI 39.14 kg/m  GEN: NAD EYE: Sclerae anicteric ENT: MMM CV: Non-tachycardic Pulm: No increased work of breathing GI: Soft, NT/ND NEURO:  Alert & Oriented   Eulah Pont, MD [facility]  02/08/2024 11:18 AM

## 2024-02-08 NOTE — Progress Notes (Signed)
 Pt's states no medical or surgical changes since previsit or office visit.

## 2024-02-08 NOTE — Progress Notes (Signed)
 Called to room to assist during endoscopic procedure.  Patient ID and intended procedure confirmed with present staff. Received instructions for my participation in the procedure from the performing physician.

## 2024-02-08 NOTE — Op Note (Signed)
 [facility] Patient Name: Austin Leonard Procedure Date: 02/08/2024 11:12 AM MRN: [id number] Endoscopist: Madelyn Brunner Rodessa , , [phone] Age: 58 Referring MD:  Date of Birth: 1966/03/02 Gender: Male Account #: [id number] Procedure:                Colonoscopy Indications:              Positive Cologuard test Medicines:                Monitored Anesthesia Care Procedure:                Pre-Anesthesia Assessment:                           - Prior to the procedure, a History and Physical                            was performed, and patient medications and                            allergies were reviewed. The patient's tolerance of                            previous anesthesia was also reviewed. The risks                            and benefits of the procedure and the sedation                            options and risks were discussed with the patient.                            All questions were answered, and informed consent                            was obtained. Prior Anticoagulants: The patient has                            taken no anticoagulant or antiplatelet agents. ASA                            Grade Assessment: II - A patient with mild systemic                            disease. After reviewing the risks and benefits,                            the patient was deemed in satisfactory condition to                            undergo the procedure.                           After obtaining informed consent, the colonoscope  was passed under direct vision. Throughout the                            procedure, the patient's blood pressure, pulse, and                            oxygen saturations were monitored continuously. The                            Olympus Scope SN: [id number] was introduced through                            the anus and advanced to the the terminal ileum.                            The colonoscopy was performed  without difficulty.                            The patient tolerated the procedure well. The                            quality of the bowel preparation was excellent. The                            terminal ileum, ileocecal valve, appendiceal                            orifice, and rectum were photographed. Scope In: 11:25:20 AM Scope Out: 11:51:33 AM Scope Withdrawal Time: 0 hours 19 minutes 39 seconds  Total Procedure Duration: 0 hours 26 minutes 13 seconds  Findings:                 The terminal ileum appeared normal.                           Two sessile polyps were found in the transverse                            colon and cecum. The polyps were 3 to 6 mm in size.                            These polyps were removed with a cold snare.                            Resection and retrieval were complete.                           A 20 mm polyp was found in the descending colon.                            The polyp was pedunculated. The polyp was removed                            with  a hot snare. Resection and retrieval were                            complete. Area distal to and on the opposite wall                            of the polypectomy site was tattooed with an                            injection of 1 mL of Spot (carbon black).                           Non-bleeding internal hemorrhoids were found during                            retroflexion. Complications:            No immediate complications. Estimated Blood Loss:     Estimated blood loss was minimal. Impression:               - The examined portion of the ileum was normal.                           - Two 3 to 6 mm polyps in the transverse colon and                            in the cecum, removed with a cold snare. Resected                            and retrieved.                           - One 20 mm polyp in the descending colon, removed                            with a hot snare. Resected and retrieved.  Tattooed.                           - Non-bleeding internal hemorrhoids. Recommendation:           - Discharge patient to home (with escort).                           - Await pathology results.                           - The findings and recommendations were discussed                            with the patient. Dr Particia Lather "[name]" Leonides Schanz,  02/08/2024 11:57:50 AM

## 2024-02-08 NOTE — Patient Instructions (Signed)
 Be sure to have your sleep test.  You snore loudly while sleeping.  Resume all of your previous medications today.  Read all of the discharge instructions.  YOU HAD AN ENDOSCOPIC PROCEDURE TODAY AT THE [facility]:   Refer to the procedure report that was given to you for any specific questions about what was found during the examination.  If the procedure report does not answer your questions, please call your gastroenterologist to clarify.  If you requested that your care partner not be given the details of your procedure findings, then the procedure report has been included in a sealed envelope for you to review at your convenience later.  YOU SHOULD EXPECT: Some feelings of bloating in the abdomen. Passage of more gas than usual.  Walking can help get rid of the air that was put into your GI tract during the procedure and reduce the bloating. If you had a lower endoscopy (such as a colonoscopy or flexible sigmoidoscopy) you may notice spotting of blood in your stool or on the toilet paper. If you underwent a bowel prep for your procedure, you may not have a normal bowel movement for a few days.  Please Note:  You might notice some irritation and congestion in your nose or some drainage.  This is from the oxygen used during your procedure.  There is no need for concern and it should clear up in a day or so.  SYMPTOMS TO REPORT IMMEDIATELY:  Following lower endoscopy (colonoscopy or flexible sigmoidoscopy):  Excessive amounts of blood in the stool  Significant tenderness or worsening of abdominal pains  Swelling of the abdomen that is new, acute  Fever of 100F or higher  For urgent or emergent issues, a gastroenterologist can be reached at any hour by calling [phone]. Do not use MyChart messaging for urgent concerns.    DIET:  We do recommend a small meal at first, but then you may proceed to your regular diet.  Drink plenty of fluids but you should avoid alcoholic  beverages for 24 hours.  ACTIVITY:  You should plan to take it easy for the rest of today and you should NOT DRIVE or use heavy machinery until tomorrow (because of the sedation medicines used during the test).    FOLLOW UP: Our staff will call the number listed on your records the next business day following your procedure.  We will call around 7:15- 8:00 am to check on you and address any questions or concerns that you may have regarding the information given to you following your procedure. If we do not reach you, we will leave a message.     If any biopsies were taken you will be contacted by phone or by letter within the next 1-3 weeks.  Please call us at [phone] if you have not heard about the biopsies in 3 weeks.    SIGNATURES/CONFIDENTIALITY: You and/or your care partner have signed paperwork which will be entered into your electronic medical record.  These signatures attest to the fact that that the information above on your After Visit Summary has been reviewed and is understood.  Full responsibility of the confidentiality of this discharge information lies with you and/or your care-partner.

## 2024-02-11 ENCOUNTER — Telehealth: Payer: Self-pay | Admitting: *Deleted

## 2024-02-11 NOTE — Telephone Encounter (Signed)
 Attempted post procedure follow up call.  No answer - LVM.

## 2024-02-13 ENCOUNTER — Encounter: Payer: Self-pay | Admitting: Internal Medicine

## 2024-02-13 LAB — SURGICAL PATHOLOGY
